# Patient Record
Sex: Female | Born: 1970 | Race: White | Hispanic: No | Marital: Married | Smoking: Former smoker
Health system: Southern US, Community
[De-identification: ages and names within clinical notes are randomized; demographics above are authoritative.]

## PROBLEM LIST (undated history)

## (undated) DIAGNOSIS — Z973 Presence of spectacles and contact lenses: Secondary | ICD-10-CM

## (undated) DIAGNOSIS — Z87442 Personal history of urinary calculi: Secondary | ICD-10-CM

## (undated) DIAGNOSIS — B009 Herpesviral infection, unspecified: Secondary | ICD-10-CM

## (undated) DIAGNOSIS — I728 Aneurysm of other specified arteries: Secondary | ICD-10-CM

## (undated) DIAGNOSIS — N200 Calculus of kidney: Secondary | ICD-10-CM

## (undated) DIAGNOSIS — R310 Gross hematuria: Secondary | ICD-10-CM

## (undated) DIAGNOSIS — Z1501 Genetic susceptibility to malignant neoplasm of breast: Secondary | ICD-10-CM

## (undated) DIAGNOSIS — Z86018 Personal history of other benign neoplasm: Secondary | ICD-10-CM

## (undated) DIAGNOSIS — F419 Anxiety disorder, unspecified: Secondary | ICD-10-CM

## (undated) DIAGNOSIS — Z1509 Genetic susceptibility to other malignant neoplasm: Secondary | ICD-10-CM

## (undated) HISTORY — DX: Herpesviral infection, unspecified: B00.9

## (undated) HISTORY — PX: TONSILLECTOMY: SUR1361

---
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
## 1998-09-29 HISTORY — PX: BREAST SURGERY: SHX581

## 2000-04-07 ENCOUNTER — Other Ambulatory Visit: Admission: RE | Admit: 2000-04-07 | Discharge: 2000-04-07 | Payer: Self-pay | Admitting: Gynecology

## 2000-10-19 ENCOUNTER — Inpatient Hospital Stay (HOSPITAL_COMMUNITY): Admission: AD | Admit: 2000-10-19 | Discharge: 2000-10-21 | Payer: Self-pay | Admitting: Gynecology

## 2000-12-09 ENCOUNTER — Other Ambulatory Visit: Admission: RE | Admit: 2000-12-09 | Discharge: 2000-12-09 | Payer: Self-pay | Admitting: Gynecology

## 2002-08-03 ENCOUNTER — Other Ambulatory Visit: Admission: RE | Admit: 2002-08-03 | Discharge: 2002-08-03 | Payer: Self-pay | Admitting: Gynecology

## 2003-06-13 ENCOUNTER — Other Ambulatory Visit: Admission: RE | Admit: 2003-06-13 | Discharge: 2003-06-13 | Payer: Self-pay | Admitting: Gynecology

## 2004-01-06 ENCOUNTER — Encounter (INDEPENDENT_AMBULATORY_CARE_PROVIDER_SITE_OTHER): Payer: Self-pay | Admitting: Specialist

## 2004-01-06 ENCOUNTER — Inpatient Hospital Stay (HOSPITAL_COMMUNITY): Admission: AD | Admit: 2004-01-06 | Discharge: 2004-01-08 | Payer: Self-pay | Admitting: Gynecology

## 2004-02-19 ENCOUNTER — Other Ambulatory Visit: Admission: RE | Admit: 2004-02-19 | Discharge: 2004-02-19 | Payer: Self-pay | Admitting: Gynecology

## 2005-03-17 ENCOUNTER — Other Ambulatory Visit: Admission: RE | Admit: 2005-03-17 | Discharge: 2005-03-17 | Payer: Self-pay | Admitting: Gynecology

## 2005-04-08 ENCOUNTER — Encounter: Admission: RE | Admit: 2005-04-08 | Discharge: 2005-04-08 | Payer: Self-pay | Admitting: Gynecology

## 2005-05-17 ENCOUNTER — Encounter: Admission: RE | Admit: 2005-05-17 | Discharge: 2005-05-17 | Payer: Self-pay | Admitting: Gynecology

## 2006-03-19 ENCOUNTER — Other Ambulatory Visit: Admission: RE | Admit: 2006-03-19 | Discharge: 2006-03-19 | Payer: Self-pay | Admitting: Gynecology

## 2006-04-09 ENCOUNTER — Encounter: Admission: RE | Admit: 2006-04-09 | Discharge: 2006-04-09 | Payer: Self-pay | Admitting: Gynecology

## 2007-03-22 ENCOUNTER — Other Ambulatory Visit: Admission: RE | Admit: 2007-03-22 | Discharge: 2007-03-22 | Payer: Self-pay | Admitting: Gynecology

## 2007-08-30 ENCOUNTER — Encounter: Admission: RE | Admit: 2007-08-30 | Discharge: 2007-08-30 | Payer: Self-pay | Admitting: Gynecology

## 2008-03-24 ENCOUNTER — Other Ambulatory Visit: Admission: RE | Admit: 2008-03-24 | Discharge: 2008-03-24 | Payer: Self-pay | Admitting: Gynecology

## 2008-05-30 ENCOUNTER — Ambulatory Visit: Payer: Self-pay | Admitting: Gynecology

## 2008-08-30 ENCOUNTER — Encounter: Admission: RE | Admit: 2008-08-30 | Discharge: 2008-08-30 | Payer: Self-pay | Admitting: Gynecology

## 2008-09-11 ENCOUNTER — Encounter: Admission: RE | Admit: 2008-09-11 | Discharge: 2008-09-11 | Payer: Self-pay | Admitting: Gynecology

## 2009-03-28 ENCOUNTER — Encounter: Payer: Self-pay | Admitting: Gynecology

## 2009-03-28 ENCOUNTER — Other Ambulatory Visit: Admission: RE | Admit: 2009-03-28 | Discharge: 2009-03-28 | Payer: Self-pay | Admitting: Gynecology

## 2009-03-28 ENCOUNTER — Ambulatory Visit: Payer: Self-pay | Admitting: Gynecology

## 2009-05-30 ENCOUNTER — Ambulatory Visit: Payer: Self-pay | Admitting: Gynecology

## 2009-06-29 ENCOUNTER — Ambulatory Visit: Payer: Self-pay | Admitting: Gynecology

## 2009-09-05 ENCOUNTER — Encounter: Admission: RE | Admit: 2009-09-05 | Discharge: 2009-09-05 | Payer: Self-pay | Admitting: Gynecology

## 2010-03-29 ENCOUNTER — Other Ambulatory Visit: Admission: RE | Admit: 2010-03-29 | Discharge: 2010-03-29 | Payer: Self-pay | Admitting: Gynecology

## 2010-03-29 ENCOUNTER — Ambulatory Visit: Payer: Self-pay | Admitting: Gynecology

## 2010-09-06 ENCOUNTER — Encounter
Admission: RE | Admit: 2010-09-06 | Discharge: 2010-09-06 | Payer: Self-pay | Source: Home / Self Care | Attending: Gynecology | Admitting: Gynecology

## 2010-09-16 ENCOUNTER — Encounter
Admission: RE | Admit: 2010-09-16 | Discharge: 2010-09-16 | Payer: Self-pay | Source: Home / Self Care | Attending: Gynecology | Admitting: Gynecology

## 2010-10-20 ENCOUNTER — Encounter: Payer: Self-pay | Admitting: Gynecology

## 2011-02-14 NOTE — Discharge Summary (Signed)
NAME:  Erica Burch, Erica Burch                     ACCOUNT NO.:  [id number]   MEDICAL RECORD NO.:  [id number]                   PATIENT TYPE:  INP   LOCATION:  9121                                 FACILITY:  [facility]   PHYSICIAN:  Timothy P. Fontaine, M.D.           DATE OF BIRTH:  [birth year]   DATE OF ADMISSION:  01/06/2004  DATE OF DISCHARGE:  01/08/2004                                 DISCHARGE SUMMARY   DISCHARGE DIAGNOSES:  1. Pregnancy at term, delivered.  2. Perineal mole.   PROCEDURE:  1. Vaginal delivery of female infant on January 06, 2004.  2. Excision perineal mole on January 06, 2004.   Pathology [id number]:  1. Condyloma acuminata.  2. Placenta:  Meconium, mild early acute chorioamnionitis.   HOSPITAL COURSE:  A 40 year old G107 P2 female, term gestation, admitted in  labor.  Ultimately underwent spontaneous vaginal delivery producing a normal  female infant, Apgars 9 and 9.  There was no episiotomy, no laceration.  The  patient had a small mole left upper perineum that was excised.  The  patient's postpartum course was uncomplicated and she was discharged on  postpartum day #2 with a postpartum hemoglobin of 10.9.  The patient  received precautions, instructions, and follow-up to be seen in the office  for 6 weeks and will use Motrin p.r.n. for pain.                                               Timothy P. Audie Box, M.D.    TPF/MEDQ  D:  01/24/2004  T:  01/24/2004  Job:  [id number]

## 2011-02-14 NOTE — Discharge Summary (Signed)
[facility]  Patient:    Erica Burch, Erica Burch                    MRN: [id number] Adm. Date:  [id number] Disc. Date: [id number] Attending:  Tonye Royalty Dictator:   Antony Contras, NP                           Discharge Summary  DISCHARGE DIAGNOSIS:          Intrauterine pregnancy at 39-6-7 weeks, advanced cervical dilatation.  PROCEDURE:                    Vacuum-assisted vaginal delivery of viable infant over intact perineum.  HISTORY OF PRESENT ILLNESS:   The patient is a 40 year old gravida 2, para 1-0-0-1 with LMP January 15, 2000.  [facility] October 21, 2000.  Prenatal course was uncomplicated.  LABORATORY DATA:              Blood type A+.  Antibody screen negative. Sickle cell trait was positive.  RPR, hepatitis B surface antigen, HIV nonreactive, maternal serum alpha-fetoprotein normal.  GBS negative.  HOSPITAL COURSE AND TREATMENT:                    The patient was admitted for on October 19, 2000, at 39-6/7 weeks secondary to contractions and advanced cervical dilatation to 4 cm, 90% effaced, -2 station.  Artificial rupture of membranes revealed clear fluid. She progressed to complete dilatation.  She did have some deep, variable decelerations.  Delivery was accomplished per vacuum-assistance over no episiotomy.  She was delivered of Apgars 8 and 9 female infant weighing 7 pounds 2 ounces.  Placenta was delivered intact spontaneously.  POSTPARTUM COURSE:            The patient remained afebrile, had no difficulty voiding, was able to be discharged on her second postpartum day in satisfactory condition.  CBC - hematocrit 27, hemoglobin 9.9, WBC 11.5, platelets 249.  DISPOSITION:                  Follow up in six weeks.  Continue prenatal vitamins and iron.  Motrin and Tylox for pain. DD:  11/16/00 TD:  11/17/00 Job: [id number]

## 2011-02-14 NOTE — Op Note (Signed)
NAME:  Erica Burch, Erica Burch                     ACCOUNT NO.:  [id number]   MEDICAL RECORD NO.:  [id number]                   PATIENT TYPE:  INP   LOCATION:  9164                                 FACILITY:  [facility]   PHYSICIAN:  Charles A. Sydnee Cabal, MD            DATE OF BIRTH:  [birth year]   DATE OF PROCEDURE:  01/06/2004  DATE OF DISCHARGE:                                 OPERATIVE REPORT   This 40 year old female para 2-0-0-2, [facility] January 09, 2004, 39 weeks, 2 days  estimated gestational age presented complaining of onset of contractions at  0400 this morning q.5 minutes.  She notes effective fetal movements, no  ruptured membranes, bleeding.  She was 5 cm dilated upon admission.  She was  Rh positive, rubella immune.  One hour Glucola 92.  Hemoglobin 11.1.  At 28  weeks, group B Strep negative.   PAST MEDICAL HISTORY:  None.   PAST SURGICAL HISTORY:  Spontaneous vaginal delivery x2.   COMPLICATIONS:  None.   MEDICATIONS:  Prenatal vitamins.   ALLERGIES:  No known drug allergies.   SOCIAL HISTORY:  Married.  No tobacco, ethanol or drug use.   FAMILY HISTORY:  Noncontributory.   PHYSICAL EXAMINATION:  VITAL SIGNS:  Normal blood pressure.  HEENT:  Grossly normal.  LUNGS:  Clear.  CARDIOVASCULAR:  Regular rate and rhythm.  ABDOMEN:  Gravid.  Estimated fetal weight 3600 g.  Fundal height 36 cm.  Cervix 6, complete, 0, vertex, bulging bag of water.  EXTREMITIES:  Nontender, minimal edema.   Artificial rupture of membranes was done to augment labor.  Clear amniotic  fluid was noted.  There were no complications.  The patient became complete  shortly thereafter, after going on with spontaneous labor, being completely  dilated at 1830, having spontaneous vaginal delivery at 1843.  Placenta  followed spontaneously at 1849.  She had a vigorous female, Apgars 9 and 9.  Father of the baby cut the cord.  Placenta was spontaneous, three vessel and  intact.  The patient tolerated the delivery  well without complications.  The  estimated blood loss was less than 300 mL.   She requested that I remove a pedunculated mole on the left perineum mons  area.  This was bothersome in that it had interruption issues with panty  line and clothing.  This had been present for a longtime without change  other than bothersome position.  Informed consent was given.  Lidocaine was  injected around the base and mole was excised.  Silver nitrate cautery was used to achieve hemostasis after pressure was  placed.  Estimated blood loss less than 5 mL.  Mole sent to pathology.  Placenta to pathology.  Of note, placenta was consistent with retroplacental  clot over approximately 25% of the placenta.  This was asymptomatic with the  baby as far as the heart rate tracing is noted.  Charles A. Sydnee Cabal, MD    CAD/MEDQ  D:  01/06/2004  T:  01/08/2004  Job:  [id number]

## 2011-02-14 NOTE — H&P (Signed)
[facility] of [facility]  Patient:    Erica Burch, Erica Burch                    MRN: [id number] Attending:  Gaetano Hawthorne. Lily Peer, M.D.                         History and Physical  CHIEF COMPLAINT:              1. Contractions.                               2. Advanced cervical dilatation.  HISTORY:                      The patient is a 40 year old gravida 2, para 1 whose last menstrual period was January 15, 2000, estimated date of confinement October 21, 2000.  Currently 39-6/[redacted] weeks gestation.  Presented to the office today having increased contractions every 10 minutes apart and sometimes more frequent during the weekend.  On arrival to the office she was placed on the monitor.  She had a beautifully reactive NST, very mild infrequent contractions, if any, were noted on the nonstress test but a pelvic examination did demonstrate that her cervix was 4 cm, 90% effaced, -2 station. Her group B strep culture had been negative.  Her prenatal course essentially has been uneventful except for the fact at 38 weeks she was treated for what appeared to be a strep throat and had been placed on Z-Pak.  PAST MEDICAL HISTORY:         Patient with normal spontaneous vaginal delivery in 1992 at 40 weeks without any complications.  Patient had varicella as a child.  She has cholecystectomy in 1993.  She had a left benign breast lump removed in May 2000.  ALLERGIES:                    Denies.  REVIEW OF SYSTEMS:            See [place] form.  PHYSICAL EXAMINATION:  GENERAL:                      Well-developed, well-nourished female.  HEENT:                        Unremarkable.  NECK:                         Supple.  Trachea:  Midline.  No carotid bruits. No thyromegaly.  LUNGS:                        Clear to auscultation without rhonchi or wheezes.  HEART:                        Regular rate and rhythm.  No murmurs or gallops.  BREASTS:                      Done during  the first trimester, is reported to be normal.  ABDOMEN:                      Gravid uterus.  Vertex presentation by [facility] maneuver.  Positive fetal heart tones.  PELVIC:  Cervix 4 cm dilated, 90% effaced, -2 station.  EXTREMITIES:                  DTRs 1+.  Negative clonus.  PRENATAL LABORATORIES:        A positive blood type.  Negative antibody screen.  VDRL, hepatitis B surface antigen, HIV were negative.  Rubella with evidence of immunity.  Pap smear was normal.  alpha-fetoprotein and blood sugar tests were normal.  GBS culture was negative.  Patient had a low hemoglobin of 10.6 at 28 weeks for which she has been taking some supplemental iron.  ASSESSMENT:                   A 40 year old gravida 2, para 1 at 36 6/[redacted] weeks gestation with advanced cervical dilatation and labor based on history. Patient with negative GBS culture.  Urinalysis today in the office had demonstrated that there were 8-10 wbcs, 2-3 rbcs, and 3+ bacteria.  [facility]  She wished to have an epidural at this point since her cervix advanced at 4 cm.  Will start off with an epidural then initiate Pitocin and augment accordingly.  Will also give her 1 g Cefotan to cover for an underlying urinary tract infection.  Patient was counseled. Risks, benefits, pros, and cons were discussed.  Will follow according to plan.  Patient will be admitted to [facility] today. DD:  10/19/00 TD:  10/19/00 Job: [id number]

## 2011-04-03 ENCOUNTER — Other Ambulatory Visit (HOSPITAL_COMMUNITY)
Admission: RE | Admit: 2011-04-03 | Discharge: 2011-04-03 | Disposition: A | Discharge status: Home / Self Care | Payer: 59 | Source: Ambulatory Visit | Attending: Gynecology | Admitting: Gynecology

## 2011-04-03 ENCOUNTER — Encounter (INDEPENDENT_AMBULATORY_CARE_PROVIDER_SITE_OTHER): Payer: 59 | Admitting: Gynecology

## 2011-04-03 ENCOUNTER — Other Ambulatory Visit: Payer: Self-pay | Admitting: Gynecology

## 2011-04-03 DIAGNOSIS — Z124 Encounter for screening for malignant neoplasm of cervix: Secondary | ICD-10-CM | POA: Insufficient documentation

## 2011-04-03 DIAGNOSIS — R1903 Right lower quadrant abdominal swelling, mass and lump: Secondary | ICD-10-CM

## 2011-04-03 DIAGNOSIS — D259 Leiomyoma of uterus, unspecified: Secondary | ICD-10-CM

## 2011-04-03 DIAGNOSIS — Z13220 Encounter for screening for lipoid disorders: Secondary | ICD-10-CM

## 2011-04-03 DIAGNOSIS — M545 Low back pain, unspecified: Secondary | ICD-10-CM

## 2011-04-03 DIAGNOSIS — R1031 Right lower quadrant pain: Secondary | ICD-10-CM

## 2011-04-03 DIAGNOSIS — Z833 Family history of diabetes mellitus: Secondary | ICD-10-CM

## 2011-04-03 DIAGNOSIS — R635 Abnormal weight gain: Secondary | ICD-10-CM

## 2011-04-03 DIAGNOSIS — N949 Unspecified condition associated with female genital organs and menstrual cycle: Secondary | ICD-10-CM

## 2011-04-03 DIAGNOSIS — Z01419 Encounter for gynecological examination (general) (routine) without abnormal findings: Secondary | ICD-10-CM

## 2011-04-03 DIAGNOSIS — R809 Proteinuria, unspecified: Secondary | ICD-10-CM

## 2011-04-22 ENCOUNTER — Encounter (HOSPITAL_BASED_OUTPATIENT_CLINIC_OR_DEPARTMENT_OTHER): Payer: 59 | Admitting: Oncology

## 2011-04-22 DIAGNOSIS — Z1501 Genetic susceptibility to malignant neoplasm of breast: Secondary | ICD-10-CM

## 2011-04-29 ENCOUNTER — Ambulatory Visit (INDEPENDENT_AMBULATORY_CARE_PROVIDER_SITE_OTHER): Payer: 59 | Admitting: Gynecology

## 2011-04-29 ENCOUNTER — Encounter: Payer: Self-pay | Admitting: Gynecology

## 2011-04-29 VITALS — BP 122/88

## 2011-04-29 DIAGNOSIS — Z1501 Genetic susceptibility to malignant neoplasm of breast: Secondary | ICD-10-CM

## 2011-04-29 DIAGNOSIS — Z803 Family history of malignant neoplasm of breast: Secondary | ICD-10-CM

## 2011-04-29 DIAGNOSIS — D252 Subserosal leiomyoma of uterus: Secondary | ICD-10-CM

## 2011-04-29 DIAGNOSIS — R102 Pelvic and perineal pain: Secondary | ICD-10-CM

## 2011-04-29 DIAGNOSIS — N949 Unspecified condition associated with female genital organs and menstrual cycle: Secondary | ICD-10-CM

## 2011-04-29 NOTE — Progress Notes (Signed)
Consultation today with Erica Burch in reference to her symptomatic leiomyomatous uteri. 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 I have just spoken with Dr. Welton Flakes medical oncologist and I had referred the patient for consultation asked to consideration of removing her ovaries at time of her hysterectomy. Her last ultrasound the office on July 5 demonstrated uterus to measure 9.1 x 5.7 x 4.3 cm with an endometrial stripe of 2.7 mm she had a right pedunculated myoma measuring 8.3 x 4.9 x 8.0 cm and right and left ovary with several follicles patient's cancer yearly mammogram up-to-date as well as her mammogram. Patient's fibroids are causing her much symptoms despite her having a Mirena IUD she complains a lot of low back discomfort constipation and right lower abdominal sided pain. I spoke with Dr. Doylene Canard today and her recommendation would be to remove patient's ovary at time of her planned total laparoscopic hysterectomy. She would recommend the patient be placed in a low dose estrogen for short period of time and perhaps consideration simultaneously place her on SSRI such as Effexor. Patient's CA 125 had been normal recently. Based on her being a BRCA1 carrier she has a 60% risk of developing ovarian cancer. Her risk of breast cancer is reduced by 30 to 40s percent by removing both ovaries was a "to Dr. Dionne Milo given me. Patient is still not want to have a mastectomy at this time. She had been referred to Dr. Dillard Essex for consideration of prophylactic mastectomy and he had quoted a reduction of breast cancer by 90% the patient decided to proceed with a prophylactic mastectomy. She would like to wait would later times to have this done. Patient is with positive BRCA one G. carrier have a 75-80% risk of breast cancer. I discussed  these issues with Erica Burch today and we will through a detailed discussion of her planned surgery as she would like to get it done as soon as possible. We will proceed with get scheduling her total laparoscopic hysterectomy with bilateral salpingo-oophorectomy. Literature formation it previously been provided as well. All questions rancher will follow accordingly.

## 2011-05-12 ENCOUNTER — Encounter (HOSPITAL_COMMUNITY)
Admission: RE | Admit: 2011-05-12 | Discharge: 2011-05-12 | Payer: 59 | Source: Ambulatory Visit | Attending: Gynecology | Admitting: Gynecology

## 2011-05-12 ENCOUNTER — Encounter (HOSPITAL_COMMUNITY): Payer: Self-pay

## 2011-05-12 LAB — CBC
MCV: 85.5 fL (ref 78.0–100.0)
Platelets: 238 10*3/uL (ref 150–400)
RDW: 12 % (ref 11.5–15.5)
WBC: 6.4 10*3/uL (ref 4.0–10.5)

## 2011-05-12 LAB — SURGICAL PCR SCREEN
MRSA, PCR: NEGATIVE
Staphylococcus aureus: NEGATIVE

## 2011-05-12 NOTE — Patient Instructions (Signed)
  Mysty Kielty Your procedure is scheduled ZO:[id number] 05/19/11 Enter through the Main Entrance of [facility] at:11:30am Pick up the phone a the desk and dial [phone] Please call this number if you have any problems the morning of surgery: [phone]  Remember: Do not eat food after midnight  Do not drink clear liquids after:Monday 9am Take these medicines the morning of surgery with a SIP OF WATER:meds  Do not wear jewelry, make-up, or nail polish Do not wear lotions, powders, or perfumes.no deodorants Do not shave 48 hours prior to surgery. Do not bring valuables to the hospital. Leave suitcase in the car. After Surgery it may be brought to your room. For patients being admitted to the hospital, checkout time is 11:00am the day of discharge.   Please read over the Hibiclens Guide to General Skin Cleansing at Home. Remember that hibiclens is not to be used on the head, face, or vaginal area. Please shower with 1/2 bottle the evening before surgery and other 1/2 bottle morning of surgery prior to arrival at hospital.  Do not use any deodorants, lotions, or powders after hibiclens shower.

## 2011-05-13 ENCOUNTER — Encounter (HOSPITAL_COMMUNITY): Payer: Self-pay | Admitting: Gynecology

## 2011-05-18 ENCOUNTER — Encounter (HOSPITAL_COMMUNITY): Payer: Self-pay | Admitting: Gynecology

## 2011-05-18 DIAGNOSIS — D259 Leiomyoma of uterus, unspecified: Secondary | ICD-10-CM | POA: Diagnosis present

## 2011-05-18 DIAGNOSIS — Z1501 Genetic susceptibility to malignant neoplasm of breast: Secondary | ICD-10-CM | POA: Diagnosis present

## 2011-05-18 NOTE — H&P (Signed)
  Erica Burch [birth year] [id number]   History:    40 y.o. G3P3 scheduled for TLH/BSO secondary to symptomatic leiomyomatous uteri. Patient is a BRCA1 Carrier for breat cancer and has strong family history of breast cancer (mother breast cancer age 88, 2 grandmothers on mother's side, as well as an aunt). Patient has received genetic counseling from Dr. Welton Flakes at the [facility] here in [place] and have recommended to proceed with BSO at time of hysterectomy  Due to high association of ovarian cancerin patients who are are BRCA1 Positive carriers for breast cancer as well. Patient will consider a prophylactic (bilateral) at a later date).  Past medical history,surgical history, family history and social history were all reviewed and documented in the EPIC chart. ROS:  Was performed and pertinent positives and negatives are included in the history.   Exam: Wt:150 lbs  BMI 26  BP:124/88 chaperone present General appearance : Well developed well nourished female. Skin grossly normal HEENT: Neck supple, trachea midline Lungs: Clear to auscultation, no rhonchi or wheezes Heart: Regular rate and rhythm, no murmurs or gallops Breast:Examined in sitting and supine position were symmetrical in appearance, no palpable masses, to skin retraction, no nipple inversion, no nipple discharge and no axillary or supraclavicular lymphadenopathy Abdomen: no palpable masses or tenderness Pelvic  Ext/BUS/vagina  normal   Cervix  Normal, IUD string seen   Uterus  Irregular shaped with fundal fibroid   Adnexa  Without masses or tenderness  Anus and perineum  normal   Rectovaginal  normal sphincter tone without palpated masses or tenderness             Hemoccult not done     Assessment/Plan:  40 y.o. G3P3 (vaginal deliveries) with symptomatic fibroid uterus and a carrier for BRCA1 Breast Cancer. Ultrasound in office demonstrated a uterus that measured 9x5x4 cm with a right pedunculated fibroid  measuring 8.3x4.9x8.0 cm. IUD seen in cavity with stripe of 2.7 mm. The risk and benefits of the procedure were discussed with patient and all questions were answered. Risk of: hemorrhage and needing transfusion with risk of anaphylactic reaction, HIV, Hepatitis from donor blood to recipient were discussed. Also, risk of DVT/PE  and need for PSA Stockings were discussed. There is also risk of trauma to internal organs requiring surgical repair to bladder, intestines, ureters and other internal structures. Also in the event of technical difficulty an open abdominal approach may be used to complete the operation which would require additional days of hospitalization. Patient also understands that she will not be able to get pregnant in the future. She also understands that this could put her in a menopausal state and that with the recommendation of the medical oncologist she would go on a short course of low dose estrogen to help alleviate those symptoms. Patient fully understands all the implications of here surgery and all questions have been answered and will proceed with TLH/BSO possible TAH/BSO Ok Edwards MD, 10:42 AM 05/18/2011     An

## 2011-05-19 ENCOUNTER — Inpatient Hospital Stay (HOSPITAL_COMMUNITY)
Admission: RE | Admit: 2011-05-19 | Discharge: 2011-05-21 | DRG: 743 | Disposition: A | Discharge status: Home / Self Care | Payer: 59 | Source: Ambulatory Visit | Attending: Gynecology | Admitting: Gynecology

## 2011-05-19 ENCOUNTER — Encounter (HOSPITAL_COMMUNITY): Payer: Self-pay | Admitting: Anesthesiology

## 2011-05-19 ENCOUNTER — Other Ambulatory Visit: Payer: Self-pay | Admitting: Gynecology

## 2011-05-19 ENCOUNTER — Inpatient Hospital Stay (HOSPITAL_COMMUNITY): Payer: 59 | Admitting: Anesthesiology

## 2011-05-19 ENCOUNTER — Encounter (HOSPITAL_COMMUNITY)
Admission: RE | Disposition: A | Discharge status: Home / Self Care | Payer: Self-pay | Source: Ambulatory Visit | Attending: Gynecology

## 2011-05-19 ENCOUNTER — Encounter (HOSPITAL_COMMUNITY): Payer: Self-pay | Admitting: *Deleted

## 2011-05-19 DIAGNOSIS — D259 Leiomyoma of uterus, unspecified: Secondary | ICD-10-CM

## 2011-05-19 DIAGNOSIS — N8300 Follicular cyst of ovary, unspecified side: Secondary | ICD-10-CM | POA: Diagnosis present

## 2011-05-19 DIAGNOSIS — Z803 Family history of malignant neoplasm of breast: Secondary | ICD-10-CM

## 2011-05-19 DIAGNOSIS — N838 Other noninflammatory disorders of ovary, fallopian tube and broad ligament: Secondary | ICD-10-CM | POA: Diagnosis present

## 2011-05-19 DIAGNOSIS — D251 Intramural leiomyoma of uterus: Secondary | ICD-10-CM | POA: Diagnosis present

## 2011-05-19 DIAGNOSIS — D252 Subserosal leiomyoma of uterus: Principal | ICD-10-CM | POA: Diagnosis present

## 2011-05-19 DIAGNOSIS — Z1501 Genetic susceptibility to malignant neoplasm of breast: Secondary | ICD-10-CM | POA: Diagnosis present

## 2011-05-19 HISTORY — PX: LAPAROSCOPY: SHX197

## 2011-05-19 HISTORY — PX: ABDOMINAL HYSTERECTOMY: SHX81

## 2011-05-19 HISTORY — PX: SALPINGOOPHORECTOMY: SHX82

## 2011-05-19 LAB — COMPREHENSIVE METABOLIC PANEL
Albumin: 4.4 g/dL (ref 3.5–5.2)
BUN: 6 mg/dL (ref 6–23)
Chloride: 104 mEq/L (ref 96–112)
Creatinine, Ser: 0.6 mg/dL (ref 0.50–1.10)
Total Bilirubin: 0.9 mg/dL (ref 0.3–1.2)

## 2011-05-19 LAB — CBC
HCT: 32.4 % — ABNORMAL LOW (ref 36.0–46.0)
Platelets: 197 10*3/uL (ref 150–400)
RDW: 12 % (ref 11.5–15.5)
WBC: 10.9 10*3/uL — ABNORMAL HIGH (ref 4.0–10.5)

## 2011-05-19 LAB — URINALYSIS, ROUTINE W REFLEX MICROSCOPIC
Glucose, UA: NEGATIVE mg/dL
Leukocytes, UA: NEGATIVE
Nitrite: NEGATIVE
Protein, ur: NEGATIVE mg/dL
Urobilinogen, UA: 0.2 mg/dL (ref 0.0–1.0)

## 2011-05-19 LAB — PREGNANCY, URINE: Preg Test, Ur: NEGATIVE

## 2011-05-19 LAB — URINE MICROSCOPIC-ADD ON

## 2011-05-19 SURGERY — HYSTERECTOMY, ABDOMINAL
Anesthesia: General | Site: Uterus | Wound class: Clean Contaminated

## 2011-05-19 MED ORDER — MIDAZOLAM HCL 5 MG/5ML IJ SOLN
INTRAMUSCULAR | Status: DC | PRN
  Administered 2011-05-19: 2 mg via INTRAVENOUS

## 2011-05-19 MED ORDER — DEXAMETHASONE SODIUM PHOSPHATE 4 MG/ML IJ SOLN
INTRAMUSCULAR | Status: DC | PRN
  Administered 2011-05-19: 10 mg via INTRAVENOUS

## 2011-05-19 MED ORDER — ONDANSETRON HCL 4 MG/2ML IJ SOLN: 4.0000 mg | Freq: Four times a day (QID) | INTRAMUSCULAR | Status: DC | PRN

## 2011-05-19 MED ORDER — DIPHENHYDRAMINE HCL 12.5 MG/5ML PO ELIX: 12.5000 mg | ORAL_SOLUTION | Freq: Four times a day (QID) | ORAL | Status: DC | PRN

## 2011-05-19 MED ORDER — CEFAZOLIN SODIUM 1-5 GM-% IV SOLN
1.0000 g | Freq: Once | INTRAVENOUS | Status: AC
  Administered 2011-05-19: 1 g via INTRAVENOUS

## 2011-05-19 MED ORDER — METOCLOPRAMIDE HCL 5 MG/ML IJ SOLN: 10.0000 mg | Freq: Once | INTRAMUSCULAR | Status: DC | PRN

## 2011-05-19 MED ORDER — PROPOFOL 10 MG/ML IV EMUL
INTRAVENOUS | Status: DC | PRN
  Administered 2011-05-19: 180 mg via INTRAVENOUS

## 2011-05-19 MED ORDER — ONDANSETRON HCL 4 MG/2ML IJ SOLN
INTRAMUSCULAR | Status: AC
  Filled 2011-05-19: qty 2

## 2011-05-19 MED ORDER — GLYCOPYRROLATE 0.2 MG/ML IJ SOLN
INTRAMUSCULAR | Status: DC | PRN
  Administered 2011-05-19: .3 mg via INTRAVENOUS
  Administered 2011-05-19: 0.2 mg via INTRAVENOUS

## 2011-05-19 MED ORDER — SUFENTANIL CITRATE 50 MCG/ML IV SOLN
INTRAVENOUS | Status: AC
  Filled 2011-05-19: qty 1

## 2011-05-19 MED ORDER — SODIUM CHLORIDE 0.9 % IJ SOLN: 9.0000 mL | INTRAMUSCULAR | Status: DC | PRN

## 2011-05-19 MED ORDER — SCOPOLAMINE 1 MG/3DAYS TD PT72
1.0000 | MEDICATED_PATCH | Freq: Once | TRANSDERMAL | Status: DC
  Administered 2011-05-19: 1.5 mg via TRANSDERMAL

## 2011-05-19 MED ORDER — DIPHENHYDRAMINE HCL 50 MG/ML IJ SOLN
12.5000 mg | Freq: Four times a day (QID) | INTRAMUSCULAR | Status: DC | PRN
  Administered 2011-05-20: 12.5 mg via INTRAVENOUS
  Filled 2011-05-19: qty 1

## 2011-05-19 MED ORDER — LACTATED RINGERS IV SOLN
INTRAVENOUS | Status: DC
  Administered 2011-05-20 (×3): via INTRAVENOUS

## 2011-05-19 MED ORDER — MIDAZOLAM HCL 2 MG/2ML IJ SOLN
INTRAMUSCULAR | Status: AC
  Filled 2011-05-19: qty 4

## 2011-05-19 MED ORDER — BUPIVACAINE HCL (PF) 0.25 % IJ SOLN
INTRAMUSCULAR | Status: DC | PRN
  Administered 2011-05-19: 18 mL

## 2011-05-19 MED ORDER — MORPHINE SULFATE (PF) 1 MG/ML IV SOLN
INTRAVENOUS | Status: DC
  Administered 2011-05-19: 25 mg via INTRAVENOUS
  Administered 2011-05-19: 15 mg via INTRAVENOUS
  Administered 2011-05-19 (×2): 25 mg via INTRAVENOUS
  Administered 2011-05-20: 12 mg via INTRAVENOUS
  Administered 2011-05-20: 7.5 mg via INTRAVENOUS
  Administered 2011-05-20: 25 mg via INTRAVENOUS
  Filled 2011-05-19 (×4): qty 25

## 2011-05-19 MED ORDER — NALOXONE HCL 0.4 MG/ML IJ SOLN: 0.4000 mg | INTRAMUSCULAR | Status: DC | PRN

## 2011-05-19 MED ORDER — GLYCOPYRROLATE 0.2 MG/ML IJ SOLN
INTRAMUSCULAR | Status: AC
  Filled 2011-05-19: qty 1

## 2011-05-19 MED ORDER — FENTANYL CITRATE 0.05 MG/ML IJ SOLN
INTRAMUSCULAR | Status: AC
  Filled 2011-05-19: qty 2

## 2011-05-19 MED ORDER — FENTANYL CITRATE 0.05 MG/ML IJ SOLN
INTRAMUSCULAR | Status: DC | PRN
  Administered 2011-05-19 (×2): 25 ug via INTRAVENOUS

## 2011-05-19 MED ORDER — SCOPOLAMINE 1 MG/3DAYS TD PT72
MEDICATED_PATCH | TRANSDERMAL | Status: AC
  Filled 2011-05-19: qty 1

## 2011-05-19 MED ORDER — LACTATED RINGERS IV SOLN
INTRAVENOUS | Status: DC
  Administered 2011-05-19: 50 mL/h via INTRAVENOUS
  Administered 2011-05-19 (×2): via INTRAVENOUS

## 2011-05-19 MED ORDER — HYDROMORPHONE HCL 1 MG/ML IJ SOLN
0.2500 mg | INTRAMUSCULAR | Status: DC | PRN
  Administered 2011-05-19 (×2): 0.5 mg via INTRAVENOUS

## 2011-05-19 MED ORDER — LIDOCAINE HCL (CARDIAC) 20 MG/ML IV SOLN
INTRAVENOUS | Status: DC | PRN
  Administered 2011-05-19: 60 mg via INTRAVENOUS

## 2011-05-19 MED ORDER — PROPOFOL 10 MG/ML IV EMUL
INTRAVENOUS | Status: AC
  Filled 2011-05-19: qty 20

## 2011-05-19 MED ORDER — ROCURONIUM BROMIDE 50 MG/5ML IV SOLN
INTRAVENOUS | Status: AC
  Filled 2011-05-19: qty 1

## 2011-05-19 MED ORDER — NEOSTIGMINE METHYLSULFATE 1 MG/ML IJ SOLN
INTRAMUSCULAR | Status: DC | PRN
  Administered 2011-05-19: 3 mg via INTRAMUSCULAR

## 2011-05-19 MED ORDER — ROCURONIUM BROMIDE 100 MG/10ML IV SOLN
INTRAVENOUS | Status: DC | PRN
  Administered 2011-05-19: 45 mg via INTRAVENOUS

## 2011-05-19 MED ORDER — SUFENTANIL CITRATE 50 MCG/ML IV SOLN
INTRAVENOUS | Status: DC | PRN
  Administered 2011-05-19 (×5): 10 ug via INTRAVENOUS

## 2011-05-19 MED ORDER — CEFAZOLIN SODIUM 1-5 GM-% IV SOLN
INTRAVENOUS | Status: AC
  Filled 2011-05-19: qty 50

## 2011-05-19 MED ORDER — ONDANSETRON HCL 4 MG/2ML IJ SOLN
INTRAMUSCULAR | Status: DC | PRN
  Administered 2011-05-19: 4 mg via INTRAVENOUS

## 2011-05-19 MED ORDER — NEOSTIGMINE METHYLSULFATE 1 MG/ML IJ SOLN
INTRAMUSCULAR | Status: AC
  Filled 2011-05-19: qty 10

## 2011-05-19 MED ORDER — DEXAMETHASONE SODIUM PHOSPHATE 10 MG/ML IJ SOLN
INTRAMUSCULAR | Status: AC
  Filled 2011-05-19: qty 1

## 2011-05-19 MED ORDER — HYDROMORPHONE HCL 1 MG/ML IJ SOLN
INTRAMUSCULAR | Status: AC
  Administered 2011-05-19: 0.5 mg via INTRAVENOUS
  Filled 2011-05-19: qty 1

## 2011-05-19 MED ORDER — MEPERIDINE HCL 25 MG/ML IJ SOLN: 6.2500 mg | INTRAMUSCULAR | Status: DC | PRN

## 2011-05-19 SURGICAL SUPPLY — 57 items
BARRIER ADHS 3X4 INTERCEED (GAUZE/BANDAGES/DRESSINGS) IMPLANT
BLADE SURG 15 STRL LF C SS BP (BLADE) ×4 IMPLANT
BLADE SURG 15 STRL SS (BLADE) ×5
BRR ADH 4X3 ABS CNTRL BYND (GAUZE/BANDAGES/DRESSINGS)
CANISTER SUCTION 2500CC (MISCELLANEOUS) ×7 IMPLANT
CLOTH BEACON ORANGE TIMEOUT ST (SAFETY) ×5 IMPLANT
CONT PATH 16OZ SNAP LID 3702 (MISCELLANEOUS) IMPLANT
COVER MAYO STAND STRL (DRAPES) ×5 IMPLANT
COVER TABLE BACK 60X90 (DRAPES) ×7 IMPLANT
DERMABOND ADVANCED (GAUZE/BANDAGES/DRESSINGS) ×5 IMPLANT
DRAPE HYSTEROSCOPY (DRAPE) ×5 IMPLANT
DRAPE UTILITY XL STRL (DRAPES) ×5 IMPLANT
DRSG TEGADERM 2.38X2.75 (GAUZE/BANDAGES/DRESSINGS) IMPLANT
DRSG XEROFORM 1X8 (GAUZE/BANDAGES/DRESSINGS) ×5 IMPLANT
EVACUATOR SMOKE 8.L (FILTER) ×5 IMPLANT
GLOVE BIOGEL PI IND STRL 8 (GLOVE) ×4 IMPLANT
GLOVE BIOGEL PI INDICATOR 8 (GLOVE) ×1
GLOVE ECLIPSE 7.5 STRL STRAW (GLOVE) ×14 IMPLANT
GOWN PREVENTION PLUS LG XLONG (DISPOSABLE) ×17 IMPLANT
HARMONIC RUM II 3.5CM SILVER (DISPOSABLE) ×5
HEMOSTAT SURGICEL 4X8 (HEMOSTASIS) ×2 IMPLANT
NS IRRIG 1000ML POUR BTL (IV SOLUTION) ×5 IMPLANT
PACK LAVH (CUSTOM PROCEDURE TRAY) ×2 IMPLANT
PAD ABD DERMACEA PRESS 5X9 (GAUZE/BANDAGES/DRESSINGS) ×4 IMPLANT
PAD OB MATERNITY 4.3X12.25 (PERSONAL CARE ITEMS) ×3 IMPLANT
PAIN PUMP ON-Q 270MLX4ML 5IN (PAIN MANAGEMENT) IMPLANT
SCALPEL HARMONIC ACE (MISCELLANEOUS) ×5 IMPLANT
SCALPEL HRMNC RUM II 3.5 SILVR (DISPOSABLE) ×1 IMPLANT
SCISSORS LAP 5X35 DISP (ENDOMECHANICALS) ×2 IMPLANT
SET IRRIG TUBING LAPAROSCOPIC (IRRIGATION / IRRIGATOR) ×5 IMPLANT
SPONGE LAP 18X18 X RAY DECT (DISPOSABLE) ×14 IMPLANT
STAPLER VISISTAT 35W (STAPLE) ×3 IMPLANT
SUT CHROMIC 3 0 SH 27 (SUTURE) IMPLANT
SUT VIC AB 0 CT1 18XCR BRD8 (SUTURE) ×11 IMPLANT
SUT VIC AB 0 CT1 36 (SUTURE) ×10 IMPLANT
SUT VIC AB 0 CT1 8-18 (SUTURE) ×10
SUT VIC AB 1 CT1 18XBRD ANBCTR (SUTURE) IMPLANT
SUT VIC AB 1 CT1 8-18 (SUTURE)
SUT VIC AB 2-0 SH 27 (SUTURE) ×5
SUT VIC AB 2-0 SH 27XBRD (SUTURE) ×1 IMPLANT
SUT VIC AB 3-0 CT1 27 (SUTURE) ×10
SUT VIC AB 3-0 CT1 TAPERPNT 27 (SUTURE) ×2 IMPLANT
SUT VICRYL 0 TIES 12 18 (SUTURE) ×5 IMPLANT
SUT VICRYL 0 UR6 27IN ABS (SUTURE) ×5 IMPLANT
SUT VICRYL 3 0 BR 18  UND (SUTURE)
SUT VICRYL 3 0 BR 18 UND (SUTURE) IMPLANT
SUT VICRYL RAPIDE 3 0 (SUTURE) ×5 IMPLANT
SYR 50ML LL SCALE MARK (SYRINGE) ×5 IMPLANT
TIP UTERINE 6.7X8CM BLUE DISP (MISCELLANEOUS) ×2 IMPLANT
TOWEL OR 17X24 6PK STRL BLUE (TOWEL DISPOSABLE) ×14 IMPLANT
TRAY FOLEY CATH 14FR (SET/KITS/TRAYS/PACK) ×5 IMPLANT
TROCAR XCEL NON-BLD 11X100MML (ENDOMECHANICALS) ×10 IMPLANT
TROCAR XCEL NON-BLD 5MMX100MML (ENDOMECHANICALS) ×10 IMPLANT
TUBING CONNECTING 10 (TUBING) ×2 IMPLANT
WARMER LAPAROSCOPE (MISCELLANEOUS) ×5 IMPLANT
WATER STERILE IRR 1000ML POUR (IV SOLUTION) ×5 IMPLANT
YANKAUER SUCT BULB TIP NO VENT (SUCTIONS) ×2 IMPLANT

## 2011-05-19 NOTE — Transfer of Care (Signed)
Immediate Anesthesia Transfer of Care Note  Patient: Erica Burch  Procedure(s) Performed:  HYSTERECTOMY ABDOMINAL; SALPINGO OOPHERECTOMY; LAPAROSCOPY DIAGNOSTIC  Patient Location: PACU  Anesthesia Type: General  Level of Consciousness: awake, alert  and oriented  Airway & Oxygen Therapy: Patient Spontanous Breathing and Patient connected to nasal cannula oxygen  Post-op Assessment: Report given to PACU RN, Post -op Vital signs reviewed and stable and Patient moving all extremities  Post vital signs: Reviewed and stable  Complications: No apparent anesthesia complications

## 2011-05-19 NOTE — Op Note (Signed)
05/19/2011  2:51 PM  PATIENT:  Erica Burch  40 y.o. female   PRE-OPERATIVE DIAGNOSIS:  Fibroid; BRCA-1 Carier; Positive Family History of Breast Cancer  POST-OPERATIVE DIAGNOSIS:  Fibroid; BRCA-1 Carrier; Positive Family History of Breast Cancer  PROCEDURE:  Procedure(s): HYSTERECTOMY ABDOMINAL BILATERAL SALPINGO OOPHERECTOMY LAPAROSCOPY DIAGNOSTIC  SURGEON:  Surgeon(s): Ok Edwards, MD Dara Lords, MD  ANESTHESIA:   general    FINDINGS: A large posterior uterine fibroid pedunculated with wide base located posteriorly attached to the lower uterine segment and right pelvic sidewall. Normal-appearing tubes and ovaries bilaterally. No other pelvic abnormalities noted  DESCRIPTION OF OPERATION: The patient was taken to the operating room after consent form had been signed. Patient had PSA stockings for DVT prophylaxis. Patient also received a gram of Cefotan for prophylaxis as well. In the operating room she underwent a successful general endotracheal anesthesia. She was then placed in the low lithotomy position the abdomen vagina and perineum were prepped and draped in the usual sterile fashion. A bimanual examination demonstrated a normal-size uterus with a fibroid in the posterior region. A RUMI uterine manipulator with the appropriate cup was placed. The Foley catheter was inserted to monitor urinary output. The drapes were then placed in a small subumbilical incision was made in a semi-lunar shape and and the 10/11 mm trocar was inserted to the peritoneal cavity. A pneumoperitoneum was then established for approximately 3 L of carbon dioxide. A systematic inspection of the pelvic cavity had demonstrated that this large fibroid was encroaching into the cul-de-sac and adhered to the right side pelvic sidewall making it unsafe to proceed to remove this uterus cervix tubes and ovary via the laparoscopic approach. We proceeded then with a Pfannenstiel skin incision which was  made 2 cm above the symphysis pubis the incision was extended from the subcutaneous tissue down to the rectus fascia were by midline nick was made and the fascia was incised in a transverse fashion. The patient was then placed in the Trendelenburg position and the O'Connor-O'Sullivan retractors were then placed. The round ligament was identified on both sides and were transected first on the right side and incising the anterior peritoneum to peel off the bladder from the lower uterine segment. Posteriorly the right ureter was identified and the right infundibulopelvic ligament. The posterior broad ligament was penetrated and the infundibulopelvic ligament was clamped cut and free tied with 0 Vicryl suture followed by transfixation stitch of 0 Vicryl suture. The posterior fibroid was adhered to the right doubly sidewall but was manually freed allowing identification of the nearby structures. The remaining broad and cardinal ligaments on the right side were clamped cut suture ligated with 0 Vicryl suture to the level the right vaginal fornix. Similar procedure was carried out on the contralateral side and with 2 curved Heaney clamps placed in the cervical vaginal region with the use of Jorgenson scissors the cervix was excised from the vagina and the cervix uterus both tubes and ovaries and fibroid was passed off the operative field for pathological evaluation. Of note pelvic washings had been obtained upon entering the abdominal cavity. Fluid was submitted for cytological evaluation. The pelvic cavity was then copiously irrigated with normal saline solution. The curve Heaney's that were on the angles were secured with a transfixation stitch of 0 Vicryl suture and the remaining vaginal cuff was closed with a figure-of-eight of 0 Vicryl suture. The pelvic cavity was copiously irrigated with normal saline solution. Surgicel was placed near the right side retroperitoneal  space and the retroperitoneum was reapproximated  with a running stitch of 3-0 Vicryl suture. Sponge count needle count were correct and the O'Connor-O'Sullivan retractor was then removed. Patient's position was reversed from Trendelenburg. The visceral peritoneum was not reapproximated. The rectus fascia was closed with a running stitch of 0 Vicryl suture. The subcutaneous tissue was reapproximated with 3-0 Vicryl suture and the skin was reapproximated with. skin staples. The subumbilical incision the fascia was reapproximated with a single stitch of 0 Vicryl suture and the skin was reapproximated with a subcuticular stitch of 3-0 Vicryl suture. For postoperative analgesia a core percent Marcaine for a total 18 cc were infiltrated in both incision sites. Pressure dressings were applied patient was extubated and transferred to recovery room with stable vital signs blood loss was 100 cc urine output 300 cc and clear and she received 2 L of lactated Ringer's. ESTIMATED BLOOD LOSS:100 cc's   Intake/Output Summary (Last 24 hours) at 05/19/11 1451 Last data filed at 05/19/11 1448  Gross per 24 hour  Intake   2500 ml  Output    400 ml  Net   2100 ml     BLOOD ADMINISTERED:none   LOCAL MEDICATIONS USED:  MARCAINE 18CC  SPECIMEN:  Source of Specimen:  Uterus tubes cervix and ovaries  DISPOSITION OF SPECIMEN:  PATHOLOGY  COUNTS:  YES  PLAN OF CARE: Transfer to PACU

## 2011-05-19 NOTE — Anesthesia Postprocedure Evaluation (Signed)
  Anesthesia Post-op Note  Patient: Erica Burch  Procedure(s) Performed:  HYSTERECTOMY ABDOMINAL; SALPINGO OOPHERECTOMY; LAPAROSCOPY DIAGNOSTIC  Patient Location: womens unit  Anesthesia Type: General  Level of Consciousness: alert  and oriented  Airway and Oxygen Therapy: Patient Spontanous Breathing  Post-op Pain: mild  Post-op Assessment: Patient's Cardiovascular Status Stable and Respiratory Function Stable  Post-op Vital Signs: Reviewed and stable  Complications: No apparent anesthesia complications

## 2011-05-19 NOTE — Anesthesia Postprocedure Evaluation (Signed)
  Anesthesia Post-op Note  Patient: Erica Burch  Procedure(s) Performed:  HYSTERECTOMY ABDOMINAL; SALPINGO OOPHERECTOMY; LAPAROSCOPY DIAGNOSTIC  Patient Location: Women's Unit  Anesthesia Type: General  Level of Consciousness: alert , oriented and sedated  Airway and Oxygen Therapy: Patient Spontanous Breathing  Post-op Pain: moderate  Post-op Assessment: Patient's Cardiovascular Status Stable and Respiratory Function Stable  Post-op Vital Signs: Reviewed and stable  Complications: No apparent anesthesia complications

## 2011-05-19 NOTE — H&P (Signed)
No change in patient's medical history in past 24 hours to the best of my knowledge.

## 2011-05-19 NOTE — Anesthesia Preprocedure Evaluation (Signed)
Anesthesia Evaluation  Name, MR# and DOB Patient awake  General Assessment Comment  Reviewed: Allergy & Precautions, H&P , NPO status , Patient's Chart, lab work & pertinent test results  Airway Mallampati: II TM Distance: >3 FB Neck ROM: full    Dental  (+) Teeth Intact   Pulmonary  clear to auscultation  breath sounds clear to auscultation none    Cardiovascular regular     Neuro/Psych Negative Neurological ROS  Negative Psych ROS  GI/Hepatic/Renal negative GI ROS  negative Liver ROS  negative Renal ROS        Endo/Other  Negative Endocrine ROS (+)      Abdominal   Musculoskeletal   Hematology negative hematology ROS (+)   Peds  Reproductive/Obstetrics negative OB ROS    Anesthesia Other Findings             Anesthesia Physical Anesthesia Plan  ASA: II  Anesthesia Plan: General   Post-op Pain Management:    Induction:   Airway Management Planned:   Additional Equipment:   Intra-op Plan:   Post-operative Plan:   Informed Consent: I have reviewed the patients History and Physical, chart, labs and discussed the procedure including the risks, benefits and alternatives for the proposed anesthesia with the patient or authorized representative who has indicated his/her understanding and acceptance.   Dental Advisory Given  Plan Discussed with: Anesthesiologist  Anesthesia Plan Comments:         Anesthesia Quick Evaluation

## 2011-05-20 ENCOUNTER — Encounter (HOSPITAL_COMMUNITY): Payer: Self-pay | Admitting: Gynecology

## 2011-05-20 MED ORDER — DIPHENHYDRAMINE HCL 25 MG PO CAPS
25.0000 mg | ORAL_CAPSULE | Freq: Four times a day (QID) | ORAL | Status: DC | PRN
  Administered 2011-05-20: 25 mg via ORAL
  Filled 2011-05-20: qty 1

## 2011-05-20 MED ORDER — SODIUM CHLORIDE 0.9 % IJ SOLN: 3.0000 mL | Freq: Two times a day (BID) | INTRAMUSCULAR | Status: DC

## 2011-05-20 MED ORDER — LACTATED RINGERS IV SOLN: INTRAVENOUS | Status: DC

## 2011-05-20 MED ORDER — OXYCODONE-ACETAMINOPHEN 5-325 MG PO TABS
1.0000 | ORAL_TABLET | ORAL | Status: DC | PRN
  Administered 2011-05-20 – 2011-05-21 (×4): 2 via ORAL
  Filled 2011-05-20 (×4): qty 2

## 2011-05-20 MED ORDER — ESTRADIOL 0.1 MG/24HR TD PTWK
0.1000 mg | MEDICATED_PATCH | TRANSDERMAL | Status: DC
  Administered 2011-05-20: 0.1 mg via TRANSDERMAL
  Filled 2011-05-20: qty 1

## 2011-05-20 NOTE — Progress Notes (Signed)
1 Day Post-Op Procedure(s): HYSTERECTOMY ABDOMINAL SALPINGO OOPHERECTOMY (Bilateral) LAPAROSCOPY DIAGNOSTIC  Subjective: Patient reports tolerating PO.    Objective: I have reviewed patient's vital signs, intake and output, medications and labs Hgb: 10.5/ HCT 11.9 U/O: >75-100 cc/hour  General: alert and no distress Resp: clear to auscultation bilaterally Cardio: regular rate and rhythm, S1, S2 normal, no murmur, click, rub or gallop GI: soft, non-tender; bowel sounds normal; no masses,  no organomegaly Extremities: extremities normal, atraumatic, no cyanosis or edema Vaginal Bleeding: none  Assessment: s/p Procedure(s): HYSTERECTOMY ABDOMINAL SALPINGO OOPHERECTOMY (Bilateral) LAPAROSCOPY DIAGNOSTIC: stable and progressing well  Plan: Advance diet Encourage ambulation Clear liquids  LOS: 1 day    Erica Burch H 05/20/2011, 11:18 AM

## 2011-05-20 NOTE — Progress Notes (Signed)
UR Chart review completed.  

## 2011-05-21 ENCOUNTER — Encounter: Payer: Self-pay | Admitting: Gynecology

## 2011-05-21 NOTE — Progress Notes (Signed)
2 Days Post-Op Procedure(s): HYSTERECTOMY ABDOMINAL/Bilateral  SALPINGO OOPHERECTOMY LAPAROSCOPY DIAGNOSTIC  Subjective: Patient reports tolerating PO, + flatus and no problems voiding.    Objective: I have reviewed patient's vital signs, intake and output, medications and labs.  General: alert, cooperative and no distress Resp: clear to auscultation bilaterally Cardio: regular rate and rhythm, S1, S2 normal, no murmur, click, rub or gallop GI: soft, non-tender; bowel sounds normal; no masses,  no organomegaly and incision: clean and intact Extremities: extremities normal, atraumatic, no cyanosis or edema Vaginal Bleeding: minimal  Assessment: s/p Procedure(s): HYSTERECTOMY ABDOMINAL/Bilateral SALPINGO OOPHERECTOMY LAPAROSCOPY DIAGNOSTIC: progressing well and tolerating diet Patient was started on Climara transdermal patch 0.1 mg yesterday. We'll transition to oral dose in the office   Plan  Advance diet Discontinue IV fluids Discharge home The patient will return to the office within a week to have her staples removed. She had been prescribed Lortab 7.5/501 by mouth every 4-6 hours when necessary and Reglan 10 mg 1 by mouth every 4-6 hours when necessary.  LOS: 2 days    Jahan Friedlander H 05/21/2011, 8:16 AM

## 2011-05-21 NOTE — Discharge Summary (Signed)
Physician Discharge Summary  Patient ID: Erica Burch MRN: [id number] DOB/AGE: [birth year] 40 y.o.  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 Due to the size of the fibroid encroached in the cul-de-sac and adhered to the right pelvic sidewall the laparoscopic approach was converted to a double abdominal hysterectomy with bilateral salpingo-oophorectomy and pelvic adhesio lysis. Patient had 100 cc of blood loss intraoperatively. She did well postoperatively. She was started on clear liquid diet on her first day postop. Had adequate urine output. After Foley catheter had been removed in and out catheter had to be utilized after 6 hours because she did not into completely. But afterwards she has continued to void spontaneously on her own had been ambulating tolerating regular diet well and this morning she was passing gas and rate to be discharged home. Her incision site was intact. Consults: none  Significant Diagnostic Studies: labs: CBC see yesterday's note. Final pathology report from uterus cervix tubes and ovaries pending at time of this dictation  Treatments: IV hydration, antibiotics: Ancef and surgery: Total abdominal hysterectomy with bilateral salpingo-oophorectomy and pelvic adhesio lysis  Discharge Exam: Blood pressure 102/66, pulse 82, temperature 97.9 F (36.6 C), temperature source Oral, resp. rate 18, last menstrual period 04/28/2009, SpO2 94.00%. General appearance: alert, cooperative and no distress Resp: clear to auscultation bilaterally Cardio:  regular rate and rhythm, S1, S2 normal, no murmur, click, rub or gallop GI: soft, non-tender; bowel sounds normal; no masses,  no organomegaly Extremities: extremities normal, atraumatic, no cyanosis or edema Skin: Skin color, texture, turgor normal. No rashes or lesions Incision/Wound: intact  Disposition:  patient was up ambulating tolerating regular diet well and was read to be discharged home on August 21 she was placed on Climara 0.1 mg transdermal estrogen patch which we will transition point lower oral estrogen as per the recommendations of the medical oncologist. Patient will return to the office at the end of the week to have her staples removed. Patient was voiding spontaneously and was passing gas vital signs were stable and she was afebrile.   Current Discharge Medication List    CONTINUE these medications which have NOT CHANGED   Details  levonorgestrel (MIRENA) 20 MCG/24HR IUD 1 each by Intrauterine route once. Inserted 05/30/09.     zolpidem (AMBIEN) 5 MG tablet Take 5 mg by mouth at bedtime as needed.           SignedOk Edwards 05/21/2011, 8:21 AM  Her labs and cells were also lysed state will

## 2011-05-23 ENCOUNTER — Ambulatory Visit (INDEPENDENT_AMBULATORY_CARE_PROVIDER_SITE_OTHER): Payer: 59 | Admitting: Gynecology

## 2011-05-23 ENCOUNTER — Encounter: Payer: Self-pay | Admitting: Gynecology

## 2011-05-23 VITALS — BP 120/70

## 2011-05-23 DIAGNOSIS — Z9889 Other specified postprocedural states: Secondary | ICD-10-CM

## 2011-05-23 NOTE — Progress Notes (Signed)
In and patient presented to the office today for to have her staples removed she is a 40 year old gravida 3 para 3 with the BRCA1 gene mutation who also had a symptomatic leiomyomatous uteri.After consultation with medical oncologist Dr. Drue Second, it was decided to remove patient's both ovaries at the time of her hysterectomy due to her BRCA1 mutation carrier status which also carries not only the risk of breast cancer but the risk of ovarian cancer at size 40-60%. Because of her surgical menopause and risk of menopausal symptoms at such a young age that she would benefit from a short-term of low dose hormone replacement therapy with estrogen. Patient is fully aware that nothing is absolute and that the risk versus the benefits have been outlined. She fully understands and accepts and will begin to taper her estrogen for the next 2 years but started on low-dose. On postop day 1 she was started on Climara transdermal patch 0.1 mg and she will be started on Vivelle dot 0.075 mg/day twice a  week. Pathology report:    Diagnosis Uterus, ovaries and fallopian tubes, with cervix - BENIGN CERVIX WITH NO SIGNIFICANT ABNORMALITIES. - BENIGN ENDOMETRIUM WITH EXOGENOUS HORMONE EFFECT. - LEIOMYOMATA. - BENIGN OVARIES WITH FOLLICULAR CYSTS. - BENIGN FALLOPIAN TUBES WITH PARATUBAL CYST.  The pathology report was discussed with the patient. She is doing well,  she is up ambulating voiding well tolerating regular diet well. She still has not had a bowel movement. We discussed about spreading out her narcotic and taking more nonsteroidals. Her incision site was intact the staples were removed and the incision was Steri-Stripped. She was instructed to take a Colace tablet in the evening and a tablespoon of MiraLax daily. Samples of Maderma cream was provided for her to apply to incision 2-3 times a day for the next 2-3 months to prevent scarring  in her abdomen from the incision. She will return to the office in 2  weeks for her first postop visit.

## 2011-05-30 ENCOUNTER — Telehealth: Payer: Self-pay | Admitting: *Deleted

## 2011-05-30 NOTE — Telephone Encounter (Signed)
Message copied by Aura Camps on Fri May 30, 2011 10:43 AM ------      Message from: Ok Edwards      Created: Fri May 30, 2011 10:14 AM       Erica Burch, please inform patient she may resume driving if she's not dependent as much on narcotics for pain relief.

## 2011-05-30 NOTE — Telephone Encounter (Signed)
LM ON PT VOICE MAIL REGARDING THE BELOW NOTE.

## 2011-05-30 NOTE — Telephone Encounter (Signed)
PT CALLING TO SEE WHEN SHE WOULD BE ABLE TO START DRIVING AGAIN? SHE HAD HYSTERECTOMY ON AUGUST 20. PLEASE ADVISE.

## 2011-06-03 ENCOUNTER — Encounter: Payer: Self-pay | Admitting: Gynecology

## 2011-06-06 ENCOUNTER — Telehealth: Payer: Self-pay | Admitting: *Deleted

## 2011-06-06 ENCOUNTER — Ambulatory Visit (INDEPENDENT_AMBULATORY_CARE_PROVIDER_SITE_OTHER): Payer: 59 | Admitting: Gynecology

## 2011-06-06 ENCOUNTER — Encounter: Payer: Self-pay | Admitting: Gynecology

## 2011-06-06 VITALS — BP 110/70

## 2011-06-06 DIAGNOSIS — E8940 Asymptomatic postprocedural ovarian failure: Secondary | ICD-10-CM

## 2011-06-06 DIAGNOSIS — Z9889 Other specified postprocedural states: Secondary | ICD-10-CM

## 2011-06-06 MED ORDER — ESTRADIOL 0.075 MG/24HR TD PTTW: 1.0000 | MEDICATED_PATCH | TRANSDERMAL | Status: DC

## 2011-06-06 NOTE — Telephone Encounter (Signed)
Pt called stating that the samples you gave her vivelle dot  patches will run out on Monday. Pt needs rx for patches if she will continue taking these. She is post op hysterectomy, she was seen today and forgot to ask for these. Please advise.

## 2011-06-06 NOTE — Progress Notes (Signed)
Patient 40 year old gravida 3 para 3 presented to the office for her three-week postop visit she status post total abdominal hysterectomy with bilateral salpingo-oophorectomy secondary to symptomatic leiomyomatous uteri and history of BRCA1 gene mutation. We have been in consultation with Dr.Kalsoom Welton Flakes her medical oncologist  who had recommended to proceed at time of her hysterectomy with removal of both ovaries. Also was recommended that she be placed on a low dose estrogen to help with her vasomotor symptoms at such a young age that for a short time. Patient been fully aware that there is no guarantee and the risks benefits and pros and cons of been discussed as well. She is currently on transdermal estrogen therapy consisting of a Vivelle dot 0.075 mg/day which she applies twice a week. Patient is doing well from her surgery has no complaints.  Abdomen: Pfannenstiel intact Pelvic Bartholin urethra Skene was within normal limits Vagina: No lesions or discharge vaginal cuff intact Bimanual exam: No tenderness or masses Adnexa: No palpable mass or tenderness Rectal exam: Not done  Assessment: 3 week postop visit status post TAH/BSO for symptomatic leiomyomatous uteri and BRCA1 carrier on low-dose hormone replacement therapy. Patient doing well will return to the office in 3 weeks for her final postop visit.

## 2011-06-27 ENCOUNTER — Encounter: Payer: Self-pay | Admitting: Gynecology

## 2011-06-27 ENCOUNTER — Ambulatory Visit (INDEPENDENT_AMBULATORY_CARE_PROVIDER_SITE_OTHER): Payer: 59 | Admitting: Gynecology

## 2011-06-27 VITALS — BP 128/86

## 2011-06-27 DIAGNOSIS — Z9889 Other specified postprocedural states: Secondary | ICD-10-CM

## 2011-06-27 NOTE — Patient Instructions (Signed)
Annual 1 year from today

## 2011-06-27 NOTE — Progress Notes (Signed)
Patient 40 year old gravida 3 para 3 who presented to the office her final six-week postop visit she status post total abdominal hysterectomy bilateral salpingo-oophorectomy for symptomatic leiomyomatous uteri and history of BRCA1 gene mutation. Patient is on Vivelle dot 0.075 mg/per day which she applies twice weekly is not complaining of any vasomotor symptoms. See previous notes as to discussion with the medical oncologist Dr. Drue Second.   REPORT OF SURGICAL PATHOLOGY FINAL DIAGNOSIS Diagnosis Uterus, ovaries and fallopian tubes, with cervix - BENIGN CERVIX WITH NO SIGNIFICANT ABNORMALITIES. - BENIGN ENDOMETRIUM WITH EXOGENOUS HORMONE EFFECT. - LEIOMYOMATA. - BENIGN OVARIES WITH FOLLICULAR CYSTS. - BENIGN FALLOPIAN TUBES WITH PARATUBAL CYST. Guerry Bruin MD Pathologist, Electronic Signature (Case signed 05/21/2011)  Exam: Abdomen: Soft nontender no rebound or guarding. Pfannenstiel scar intact. Pelvic exam: Bartholin urethra Skene glands within normal limits Vagina: No lesions or discharge vaginal cuff intact Bimanual examination: Unremarkable no palpable masses or tenderness Rectal: Not done  Assessment and plan: Patient status post total abdominal hysterectomy with bilateral salpingo-oophorectomy 6 weeks ago doing well. Was encouraged to do her monthly self breast examination. She scheduled for her mammogram in December of this year. She will continue on a low dose estrogen replacement therapy a Vivelle dot 0.075mg /day twice a week transdermal application. Patient is fully aware of the risks benefits and pros and cons and light of her history of positive BRCA1 mutation gene and will continue to work and currently with a hematologist oncologist. Maryclare Labrador see the patient in one year or when necessary. Of note we have discussed about her taking calcium 1200 mg daily and vitamin D 2000 units daily. Next year will plan on doing a bone density study.

## 2011-07-15 ENCOUNTER — Other Ambulatory Visit: Payer: Self-pay | Admitting: Oncology

## 2011-07-29 ENCOUNTER — Telehealth: Payer: Self-pay | Admitting: *Deleted

## 2011-07-29 NOTE — Telephone Encounter (Signed)
Pt called stating her insurance will lapse on Oct 31 and she will need a refill on her vivelle dot 0.075 patch. Her new insurace will begin in [place]. Pt was given 2 samples of the vivelle dot 0.075 patch.

## 2011-09-03 ENCOUNTER — Other Ambulatory Visit: Payer: Self-pay | Admitting: Gynecology

## 2011-09-03 DIAGNOSIS — Z1231 Encounter for screening mammogram for malignant neoplasm of breast: Secondary | ICD-10-CM

## 2011-10-08 ENCOUNTER — Ambulatory Visit
Admission: RE | Admit: 2011-10-08 | Discharge: 2011-10-08 | Disposition: A | Discharge status: Home / Self Care | Payer: Self-pay | Source: Ambulatory Visit | Attending: Gynecology | Admitting: Gynecology

## 2011-10-08 DIAGNOSIS — Z1231 Encounter for screening mammogram for malignant neoplasm of breast: Secondary | ICD-10-CM

## 2012-07-02 ENCOUNTER — Encounter: Payer: Self-pay | Admitting: Gynecology

## 2012-07-02 ENCOUNTER — Ambulatory Visit (INDEPENDENT_AMBULATORY_CARE_PROVIDER_SITE_OTHER): Payer: BC Managed Care – PPO | Admitting: Gynecology

## 2012-07-02 VITALS — BP 124/80 | Ht 64.0 in | Wt 147.0 lb

## 2012-07-02 DIAGNOSIS — Z803 Family history of malignant neoplasm of breast: Secondary | ICD-10-CM

## 2012-07-02 DIAGNOSIS — Z23 Encounter for immunization: Secondary | ICD-10-CM

## 2012-07-02 DIAGNOSIS — E8940 Asymptomatic postprocedural ovarian failure: Secondary | ICD-10-CM

## 2012-07-02 DIAGNOSIS — Z01419 Encounter for gynecological examination (general) (routine) without abnormal findings: Secondary | ICD-10-CM

## 2012-07-02 DIAGNOSIS — B009 Herpesviral infection, unspecified: Secondary | ICD-10-CM | POA: Insufficient documentation

## 2012-07-02 DIAGNOSIS — Z1501 Genetic susceptibility to malignant neoplasm of breast: Secondary | ICD-10-CM

## 2012-07-02 DIAGNOSIS — Z833 Family history of diabetes mellitus: Secondary | ICD-10-CM

## 2012-07-02 DIAGNOSIS — E8941 Symptomatic postprocedural ovarian failure: Secondary | ICD-10-CM

## 2012-07-02 LAB — CBC WITH DIFFERENTIAL/PLATELET
Basophils Absolute: 0 10*3/uL (ref 0.0–0.1)
Lymphocytes Relative: 43 % (ref 12–46)
Lymphs Abs: 2 10*3/uL (ref 0.7–4.0)
Neutro Abs: 2.2 10*3/uL (ref 1.7–7.7)
Platelets: 264 10*3/uL (ref 150–400)
RBC: 4.91 MIL/uL (ref 3.87–5.11)
RDW: 13.8 % (ref 11.5–15.5)
WBC: 4.7 10*3/uL (ref 4.0–10.5)

## 2012-07-02 LAB — LIPID PANEL: Cholesterol: 194 mg/dL (ref 0–200)

## 2012-07-02 LAB — GLUCOSE, RANDOM: Glucose, Bld: 81 mg/dL (ref 70–99)

## 2012-07-02 LAB — TSH: TSH: 2.548 u[IU]/mL (ref 0.350–4.500)

## 2012-07-02 NOTE — Progress Notes (Signed)
Erica Burch 1970-10-19 [id number]   History:    41 y.o.  for annual gyn exam who in 2012 had a total abdominal hysterectomy and bilateral salpingo-oophorectomy do to her strong family history of primary breast cancer and personal history of positive BRCA1 mutation. Patient has seen Dr. Odie Sera last year for consideration of prophylactic mastectomy and still has not decided. After consultation with her oncologist Dr.Khan, she had been placed on a very low dose transdermal estrogen consisting of Vivelle-Dot 0.075 mg transdermally but patient stated that she used only for 5 months and discontinued it. Patient states that her vasomotor symptoms are very far and in between. She does have some vaginal dryness. She is taking calcium with vitamin D twice a day. Patient with normal Pap smear in 2012. Patient with no prior history of abnormal Pap smears. Patient with strong family history also of diabetes whereby her father is non-insulin-dependent diabetic. Patient's last mammogram was in January of this year which was normal. Patient does her monthly self breast examination. Patient denies any family history of any GI malignancy. Pathology report from her total abdominal hysterectomy bilateral salpingo-oophorectomy in 2012 as follows:  Diagnosis Uterus, ovaries and fallopian tubes, with cervix - BENIGN CERVIX WITH NO SIGNIFICANT ABNORMALITIES. - BENIGN ENDOMETRIUM WITH EXOGENOUS HORMONE EFFECT. - LEIOMYOMATA. - BENIGN OVARIES WITH FOLLICULAR CYSTS. - BENIGN FALLOPIAN TUBES WITH PARATUBAL CYST.   Past medical history,surgical history, family history and social history were all reviewed and documented in the EPIC chart.  Gynecologic History Patient's last menstrual period was 04/28/2009. Contraception: Hysterectomy Last Pap: 2012. Results were: normal Last mammogram: 2013. Results were: normal  Obstetric History OB History    Grav Para Term Preterm Abortions TAB SAB Ect Mult Living   3 3 3              # Outc Date GA Lbr Len/2nd Wgt Sex Del Anes PTL Lv   1 TRM     F SVD      2 TRM     F SVD      3 TRM     M SVD          ROS: A ROS was performed and pertinent positives and negatives are included in the history.  GENERAL: No fevers or chills. HEENT: No change in vision, no earache, sore throat or sinus congestion. NECK: No pain or stiffness. CARDIOVASCULAR: No chest pain or pressure. No palpitations. PULMONARY: No shortness of breath, cough or wheeze. GASTROINTESTINAL: No abdominal pain, nausea, vomiting or diarrhea, melena or bright red blood per rectum. GENITOURINARY: No urinary frequency, urgency, hesitancy or dysuria. MUSCULOSKELETAL: No joint or muscle pain, no back pain, no recent trauma. DERMATOLOGIC: No rash, no itching, no lesions. ENDOCRINE: No polyuria, polydipsia, no heat or cold intolerance. No recent change in weight. HEMATOLOGICAL: No anemia or easy bruising or bleeding. NEUROLOGIC: No headache, seizures, numbness, tingling or weakness. PSYCHIATRIC: No depression, no loss of interest in normal activity or change in sleep pattern.     Exam: chaperone present  BP 124/80  Ht 5\' 4"  (1.626 m)  Wt 147 lb (66.679 kg)  BMI 25.23 kg/m2  LMP 04/28/2009  Body mass index is 25.23 kg/(m^2).  General appearance : Well developed well nourished female. No acute distress HEENT: Neck supple, trachea midline, no carotid bruits, no thyroidmegaly Lungs: Clear to auscultation, no rhonchi or wheezes, or rib retractions  Heart: Regular rate and rhythm, no murmurs or gallops Breast:Examined in sitting and supine position were symmetrical in  appearance, no palpable masses or tenderness,  no skin retraction, no nipple inversion, no nipple discharge, no skin discoloration, no axillary or supraclavicular lymphadenopathy Abdomen: no palpable masses or tenderness, no rebound or guarding Extremities: no edema or skin discoloration or tenderness  Pelvic:  Bartholin, Urethra, Skene Glands:  Within normal limits             Vagina: No gross lesions or discharge  Cervix: Absent  Uterus absent  Adnexa  Without masses or tenderness  Anus and perineum  normal   Rectovaginal  normal sphincter tone without palpated masses or tenderness             Hemoccult cards provided for patient to submit to the office for testing     Assessment/Plan:  41 y.o. female for annual exam status post total abdominal hysterectomy with bilateral salpingo-oophorectomy in 2012 as a result of strong first-line relatives with breast cancer and patient herself with positive BRCA1 gene mutation. Patient is still pondering idea of proceeding with prophylactic mastectomy. Patient CA 125 was normal preop last year. We had discussed before her surgery that based on her being a BRCA1 carrier she had a 60% risk of developing ovarian cancer. We had also discussed my conversation with her oncologist who had stated preoperatively that her risk of breast cancer would be reduced by 30-40% by removing her ovaries. Last year she had spoken with the general surgeon Dr.Hoxsworth who had quoted her that with her history prophylactic mastectomy with reduce her risk of breast cancer by 90%. She is fully aware that having the BRCA1 gene mutation she has a 75-80% risk of breast cancer. She is taking this under consideration and we will check on her insurance coverage and her out-of-pocket expense. She will continue to do her monthly breast examinations. She will followup with her mammogram in January and I have recommended she do a 3-D mammogram. We will check a CA 125 today along with her CBC, TSH, fasting lipid profile, and fasting blood sugar. New Pap smear screening guidelines discussed. No Pap smear done today. For her vaginal dryness she will be placed on Hyalo Gyn vaginal hydrating gel to reduce the symptoms of vaginal dryness and she may apply it 3 times a week and it is nonhormonal. She was admitted to the office the Hemoccult cards  for testing. Next year will do a baseline bone density study. She was instructed to continue to take her calcium and vitamin D for osteoporosis prevention as well as weightbearing exercises 3-4 times a week. She will followup with the general surgeon within the next year to proceed with her prophylactic mastectomy.   Ok Edwards MD, 11:11 AM 07/02/2012

## 2012-07-02 NOTE — Patient Instructions (Addendum)
Diphtheria, Tetanus, and Pertussis (DTaP) Vaccine What You Need to Know WHY GET VACCINATED? Diphtheria, tetanus, and pertussis are serious diseases caused by bacteria. Diphtheria and pertussis are spread from person to person. Tetanus enters the body through cuts or wounds. Diphtheria causes a thick covering in the back of the throat.  It can lead to breathing problems, paralysis, heart failure, and even death. Tetanus (Lockjaw) causes painful tightening of the muscles, usually all over the body.  It can lead to "locking" of the jaw so the victim cannot open his or her mouth or swallow. Tetanus leads to death in about 2 out of 10 cases. Pertussis (Whooping Cough) causes coughing spells so bad that it is hard for infants to eat, drink, or breathe. These spells can last for weeks.  It can lead to pneumonia, seizures (jerking and staring spells), brain damage, and death. Diphtheria, tetanus, and pertussis vaccine (DTaP) can help prevent these diseases. Most children who are vaccinated with DTaP will be protected throughout childhood. Many more children would get these diseases if we stopped vaccinating. DTaP is a safer version of an older vaccine called DTP. DTP is no longer used in the [place]. WHO SHOULD GET DTAP VACCINE AND WHEN? Children should get 5 doses of DTaP vaccine, 1 dose at each of the following ages:  2 months.  4 months.  6 months.  15 to 18 months.  4 to 6 years. DTaP may be given at the same time as other vaccines. SOME CHILDREN SHOULD NOT GET DTAP VACCINE OR SHOULD WAIT  Children with minor illnesses, such as a cold, may be vaccinated. But children who are moderately or severely ill should usually wait until they recover before getting DTaP vaccine.  Any child who had a life-threatening allergic reaction after a dose of DTaP should not get another dose.  Any child who suffered a brain or nervous system disease within 7 days after a dose of DTaP should not get  another dose.  Talk with your caregiver if your child:  Had a seizure or collapsed after a dose of DTaP.  Cried non-stop for 3 hours or more after a dose of DTaP.  Had a fever over 105 F (40.6 C) after a dose of DTaP.  Ask your caregiver for more information. Some of these children should not get another dose of pertussis vaccine, but may get a vaccine without pertussis, called DT. OLDER CHILDREN AND ADULTS  DTaP is not licensed for adolescents, adults, or children 7 years of age and older.  But older people still need protection. A vaccine called Tdap is similar to DTaP. A single dose of Tdap is recommended for people 11 through 40 years of age. Another vaccine, called Td, protects against tetanus and diphtheria, but not pertussis. It is recommended every 10 years. WHAT ARE THE RISKS FROM DTAP VACCINE?  Getting diphtheria, tetanus, or pertussis disease is much riskier than getting DTaP vaccine.  However, a vaccine, like any medicine, is capable of causing serious problems, such as severe allergic reactions. The risk of DTaP vaccine causing serious harm, or death, is extremely small. Mild Problems (Common)  Fever (up to about 1 child in 4).  Redness or swelling where the shot was given (up to about 1 child in 4).  Soreness or tenderness where the shot was given (up to about 1 child in 4). These problems occur more often after the 4th and 5th doses of the DTaP series than after earlier doses. Sometimes the 4th   or 5th dose of DTaP vaccine is followed by swelling of the entire arm or leg in which the shot was given, lasting 1 to 7 days (up to about 1 child in 30). Other mild problems include:  Fussiness (up to about 1 child in 3).  Tiredness or poor appetite (up to about 1 child in 10).  Vomiting (up to about 1 child in 50). These problems generally occur 1 to 3 days after the shot. Moderate Problems (Uncommon)  Seizure (jerking or staring) (about 1 child out of  14,000).  Non-stop crying, for 3 hours or more (up to about 1 child out of 1,000).  High fever, over 105 F (40.6 C) (about 1 child out of 16,000). Severe Problems (Very Rare)  Serious allergic reaction (less than 1 out of a million doses).  Several other severe problems have been reported after DTaP vaccine. These include:  Long-term seizures, coma, or lowered consciousness.  Permanent brain damage. These are so rare it is hard to tell if they are caused by the vaccine. Controlling fever is especially important for children who have had seizures, for any reason. It is also important if another family member has had seizures. You can reduce fever and pain by giving your child an aspirin-free pain reliever when the shot is given, and for the next 24 hours, following the package instructions. WHAT IF THERE IS A MODERATE OR SEVERE REACTION? What should I look for? Any unusual conditions, such as a serious allergic reaction, high fever, or unusual behavior. Serious allergic reactions are extremely rare with any vaccine. If one were to occur, it would most likely be within a few minutes to a few hours after the shot. Signs can include difficulty breathing, hoarseness or wheezing, hives, paleness, weakness, a fast heartbeat, or dizziness. If a high fever or seizure were to occur, it would usually be within a week after the shot. What should I do?  Call your caregiver or get the person to a caregiver right away.  Tell the caregiver what happened, the date and time it happened, and when the vaccination was given.  Ask the caregiver, nurse, or health department to file a Vaccine Adverse Event Reporting System (VAERS) form. Or, you can file this report through the VAERS website at www.vaers.hhs.gov or by calling 1-800-822-7967. VAERS does not provide medical advice. THE NATIONAL VACCINE INJURY COMPENSATION PROGRAM  In the rare event that you or your child has a serious reaction to a vaccine, a  federal program has been created to help you pay for the care of those who have been harmed.  For details about the National Vaccine Injury Compensation Program, call 1-800-338-2382 or visit the program's website at www.hrsa.gov/vaccinecompensation HOW CAN I LEARN MORE?  Ask your caregiver. They can give you the vaccine package insert or suggest other sources of information.  Call your local or state health department's immunization program.  Contact the Centers for Disease Control and Prevention (CDC):  Call 1-800-232-4636 (1-800-CDC-INFO).  Visit the National Immunization Program's website at www.cdc.gov/vaccines CDC Diphtheria, Tetanus, and Pertussis (DTaP) Vaccine VIS (02/12/06) Document Released: 07/13/2006 Document Revised: 12/08/2011 Document Reviewed: 07/13/2006 [employer] Patient Information 2013 [employer].  

## 2012-07-03 LAB — URINALYSIS W MICROSCOPIC + REFLEX CULTURE
Bilirubin Urine: NEGATIVE
Casts: NONE SEEN
Crystals: NONE SEEN
Glucose, UA: NEGATIVE mg/dL
Specific Gravity, Urine: 1.011 (ref 1.005–1.030)
Squamous Epithelial / LPF: NONE SEEN
Urobilinogen, UA: 0.2 mg/dL (ref 0.0–1.0)
pH: 7 (ref 5.0–8.0)

## 2012-07-04 LAB — URINE CULTURE

## 2012-07-05 ENCOUNTER — Other Ambulatory Visit: Payer: Self-pay | Admitting: Gynecology

## 2012-07-05 DIAGNOSIS — N390 Urinary tract infection, site not specified: Secondary | ICD-10-CM

## 2012-07-05 MED ORDER — AMPICILLIN 250 MG PO CAPS: 250.0000 mg | ORAL_CAPSULE | Freq: Two times a day (BID) | ORAL | Status: DC

## 2012-07-08 ENCOUNTER — Encounter: Payer: Self-pay | Admitting: Gynecology

## 2012-08-09 ENCOUNTER — Other Ambulatory Visit: Payer: BC Managed Care – PPO

## 2012-08-09 DIAGNOSIS — N390 Urinary tract infection, site not specified: Secondary | ICD-10-CM

## 2012-08-10 LAB — URINALYSIS W MICROSCOPIC + REFLEX CULTURE
Bacteria, UA: NONE SEEN
Bilirubin Urine: NEGATIVE
Casts: NONE SEEN
Glucose, UA: NEGATIVE mg/dL
Hgb urine dipstick: NEGATIVE
Ketones, ur: NEGATIVE mg/dL
pH: 7 (ref 5.0–8.0)

## 2012-10-04 ENCOUNTER — Other Ambulatory Visit: Payer: Self-pay | Admitting: Gynecology

## 2012-10-04 DIAGNOSIS — Z1231 Encounter for screening mammogram for malignant neoplasm of breast: Secondary | ICD-10-CM

## 2012-10-29 ENCOUNTER — Ambulatory Visit
Admission: RE | Admit: 2012-10-29 | Discharge: 2012-10-29 | Disposition: A | Discharge status: Home / Self Care | Payer: BC Managed Care – PPO | Source: Ambulatory Visit | Attending: Gynecology | Admitting: Gynecology

## 2012-10-29 DIAGNOSIS — Z1231 Encounter for screening mammogram for malignant neoplasm of breast: Secondary | ICD-10-CM

## 2013-07-04 ENCOUNTER — Encounter: Payer: BC Managed Care – PPO | Admitting: Gynecology

## 2013-07-06 ENCOUNTER — Ambulatory Visit (INDEPENDENT_AMBULATORY_CARE_PROVIDER_SITE_OTHER): Payer: BC Managed Care – PPO | Admitting: Gynecology

## 2013-07-06 ENCOUNTER — Encounter: Payer: Self-pay | Admitting: Gynecology

## 2013-07-06 VITALS — BP 150/94 | Ht 64.5 in | Wt 148.0 lb

## 2013-07-06 DIAGNOSIS — E8940 Asymptomatic postprocedural ovarian failure: Secondary | ICD-10-CM

## 2013-07-06 DIAGNOSIS — Z01419 Encounter for gynecological examination (general) (routine) without abnormal findings: Secondary | ICD-10-CM

## 2013-07-06 DIAGNOSIS — Z1501 Genetic susceptibility to malignant neoplasm of breast: Secondary | ICD-10-CM

## 2013-07-06 DIAGNOSIS — E8941 Symptomatic postprocedural ovarian failure: Secondary | ICD-10-CM

## 2013-07-06 DIAGNOSIS — Z23 Encounter for immunization: Secondary | ICD-10-CM

## 2013-07-06 LAB — CBC WITH DIFFERENTIAL/PLATELET
Basophils Absolute: 0 10*3/uL (ref 0.0–0.1)
Basophils Relative: 1 % (ref 0–1)
Eosinophils Relative: 1 % (ref 0–5)
Lymphocytes Relative: 48 % — ABNORMAL HIGH (ref 12–46)
Neutro Abs: 1.6 10*3/uL — ABNORMAL LOW (ref 1.7–7.7)
Platelets: 265 10*3/uL (ref 150–400)
RDW: 13.6 % (ref 11.5–15.5)
WBC: 3.6 10*3/uL — ABNORMAL LOW (ref 4.0–10.5)

## 2013-07-06 LAB — HEMOGLOBIN A1C: Mean Plasma Glucose: 103 mg/dL (ref <117)

## 2013-07-06 NOTE — Progress Notes (Signed)
Erica Burch 09-07-1971 [id number]   History:    42 y.o.  for annual gyn exam who in 2012 had a total abdominal hysterectomy and bilateral salpingo-oophorectomy do to her strong family history of primary breast cancer and personal history of positive BRCA1 mutation. Patient has seen Dr. Odie Sera in 242for consideration of prophylactic mastectomy and still has not decided. After consultation with her oncologist Dr.Khan, she had been placed on a very low dose transdermal estrogen consisting of Vivelle-Dot 0.075 mg transdermally but patient stated that she used only for 5 months and discontinued it. Patient states that her vasomotor symptoms are very far and in between. She does have some vaginal dryness. She is taking calcium with vitamin D twice a day. Patient with normal Pap smear in 2012. Patient with no prior history of abnormal Pap smears. Patient with strong family history also of diabetes whereby her father is non-insulin-dependent diabetic. Patient's last mammogram was in January of this year which was normal. Patient does her monthly self breast examination. Patient denies any family history of any GI malignancy.    Past medical history,surgical history, family history and social history were all reviewed and documented in the EPIC chart.  Gynecologic History Patient's last menstrual period was 04/28/2009. Contraception: status post hysterectomy Last Pap: 2012. Results were: normal Last mammogram: 2014. Results were: normal  Obstetric History OB History  Gravida Para Term Preterm AB SAB TAB Ectopic Multiple Living  3 3 3            # Outcome Date GA Lbr Len/2nd Weight Sex Delivery Anes PTL Lv  3 TRM     M SVD     2 TRM     F SVD     1 TRM     F SVD          ROS: A ROS was performed and pertinent positives and negatives are included in the history.  GENERAL: No fevers or chills. HEENT: No change in vision, no earache, sore throat or sinus congestion. NECK: No pain or  stiffness. CARDIOVASCULAR: No chest pain or pressure. No palpitations. PULMONARY: No shortness of breath, cough or wheeze. GASTROINTESTINAL: No abdominal pain, nausea, vomiting or diarrhea, melena or bright red blood per rectum. GENITOURINARY: No urinary frequency, urgency, hesitancy or dysuria. MUSCULOSKELETAL: No joint or muscle pain, no back pain, no recent trauma. DERMATOLOGIC: No rash, no itching, no lesions. ENDOCRINE: No polyuria, polydipsia, no heat or cold intolerance. No recent change in weight. HEMATOLOGICAL: No anemia or easy bruising or bleeding. NEUROLOGIC: No headache, seizures, numbness, tingling or weakness. PSYCHIATRIC: No depression, no loss of interest in normal activity or change in sleep pattern.     Exam: chaperone present  BP 150/94  Ht 5' 4.5" (1.638 m)  Wt 148 lb (67.132 kg)  BMI 25.02 kg/m2  LMP 04/28/2009  Body mass index is 25.02 kg/(m^2).  General appearance : Well developed well nourished female. No acute distress HEENT: Neck supple, trachea midline, no carotid bruits, no thyroidmegaly Lungs: Clear to auscultation, no rhonchi or wheezes, or rib retractions  Heart: Regular rate and rhythm, no murmurs or gallops Breast:Examined in sitting and supine position were symmetrical in appearance, no palpable masses or tenderness,  no skin retraction, no nipple inversion, no nipple discharge, no skin discoloration, no axillary or supraclavicular lymphadenopathy Abdomen: no palpable masses or tenderness, no rebound or guarding Extremities: no edema or skin discoloration or tenderness  Pelvic:  Bartholin, Urethra, Skene Glands: Within normal limits  Vagina: No gross lesions or discharge  Cervix: absent  Uterus Absence  Adnexa  Without masses or tenderness  Anus and perineum  normal   Rectovaginal  normal sphincter tone without palpated masses or tenderness             Hemoccult card provided     Assessment/Plan:  42 y.o. female fstatus post total  abdominal hysterectomy with bilateral salpingo-oophorectomy in 2012 as a result of strong first-line relatives with breast cancer and patient herself with positive BRCA1 gene mutation. Patient is still pondering idea of proceeding with prophylactic mastectomy. Patient CA 125 was normal in 2013.We had discussed before her surgery that based on her being a BRCA1 carrier she had a 60% risk of developing ovarian cancer. We had also discussed my conversation with her oncologist who had stated preoperatively that her risk of breast cancer would be reduced by 30-40% by removing her ovaries. Last year she had spoken with the general surgeon Dr.Hoxsworth who had quoted her that with her history prophylactic mastectomy with reduce her risk of breast cancer by 90%. She is fully aware that having the BRCA1 gene mutation she has a 75-80% risk of breast cancer. She is taking this under consideration and we will check on her insurance coverage and her out-of-pocket expense. She will continue to do her monthly breast examinations. Patient with normal 3 the mammogram this year.We will check a CA 125 today along with her CBC, TSH, screening cholesterol and hemoglobin A1c.New Pap smear screening guidelines discussed. No Pap smear done today. For her vaginal dryness she will be placed on Hyalo Gyn vaginal hydrating gel to reduce the symptoms of vaginal dryness and she may apply it 3 times a week and it is nonhormonal. She was provided with Hemoccult cards to submit to the office for testing. She will be scheduled for a baseline bone density study.She was instructed to continue to take her calcium and vitamin D for osteoporosis prevention as well as weightbearing exercises 3-4 times a week. She will followup with the general surgeon within the next year to proceed with her prophylactic mastectomy.   Of note patient had a left breast lumpectomy in the year 2000 Which was benign.  Note: This dictation was prepared with   Dragon/digital dictation along withSmart phrase technology. Any transcriptional errors that result from this process are unintentional.   Ok Edwards MD, 12:21 PM 07/06/2013

## 2013-07-06 NOTE — Patient Instructions (Signed)
Influenza Vaccine (Flu Vaccine, Inactivated) 2013 2014 What You Need to Know WHY GET VACCINATED?  Influenza ("flu") is a contagious disease that spreads around the [place] every winter, usually between October and May.  Flu is caused by the influenza virus, and can be spread by coughing, sneezing, and close contact.  Anyone can get flu, but the risk of getting flu is highest among children. Symptoms come on suddenly and may last several days. They can include:  Fever or chills.  Sore throat.  Muscle aches.  Fatigue.  Cough.  Headache.  Runny or stuffy nose. Flu can make some people much sicker than others. These people include young children, people 65 and older, pregnant women, and people with certain health conditions such as heart, lung or kidney disease, or a weakened immune system. Flu vaccine is especially important for these people, and anyone in close contact with them. Flu can also lead to pneumonia, and make existing medical conditions worse. It can cause diarrhea and seizures in children. Each year thousands of people in the [place] die from flu, and many more are hospitalized. Flu vaccine is the best protection we have from flu and its complications. Flu vaccine also helps prevent spreading flu from person to person. INACTIVATED FLU VACCINE There are 2 types of influenza vaccine:  You are getting an inactivated flu vaccine, which does not contain any live influenza virus. It is given by injection with a needle, and often called the "flu shot."  A different live, attenuated (weakened) influenza vaccine is sprayed into the nostrils. This vaccine is described in a separate Vaccine Information Statement. Flu vaccine is recommended every year. Children 6 months through 8 years of age should get 2 doses the first year they get vaccinated. Flu viruses are always changing. Each year's flu vaccine is made to protect from viruses that are most likely to cause disease  that year. While flu vaccine cannot prevent all cases of flu, it is our best defense against the disease. Inactivated flu vaccine protects against 3 or 4 different influenza viruses. It takes about 2 weeks for protection to develop after the vaccination, and protection lasts several months to a year. Some illnesses that are not caused by influenza virus are often mistaken for flu. Flu vaccine will not prevent these illnesses. It can only prevent influenza. A "high-dose" flu vaccine is available for people 65 years of age and older. The person giving you the vaccine can tell you more about it. Some inactivated flu vaccine contains a very small amount of a mercury-based preservative called thimerosal. Studies have shown that thimerosal in vaccines is not harmful, but flu vaccines that do not contain a preservative are available. SOME PEOPLE SHOULD NOT GET THIS VACCINE Tell the person who gives you the vaccine:  If you have any severe (life-threatening) allergies. If you ever had a life-threatening allergic reaction after a dose of flu vaccine, or have a severe allergy to any part of this vaccine, you may be advised not to get a dose. Most, but not all, types of flu vaccine contain a small amount of egg.  If you ever had Guillain Barr Syndrome (a severe paralyzing illness, also called GBS). Some people with a history of GBS should not get this vaccine. This should be discussed with your doctor.  If you are not feeling well. They might suggest waiting until you feel better. But you should come back. RISKS OF A VACCINE REACTION With a vaccine, like any medicine, there   is a chance of side effects. These are usually mild and go away on their own. Serious side effects are also possible, but are very rare. Inactivated flu vaccine does not contain live flu virus, sogetting flu from this vaccine is not possible. Brief fainting spells and related symptoms (such as jerking movements) can happen after any medical  procedure, including vaccination. Sitting or lying down for about 15 minutes after a vaccination can help prevent fainting and injuries caused by falls. Tell your doctor if you feel dizzy or lightheaded, or have vision changes or ringing in the ears. Mild problems following inactivated flu vaccine:  Soreness, redness, or swelling where the shot was given.  Hoarseness; sore, red or itchy eyes; or cough.  Fever.  Aches.  Headache.  Itching.  Fatigue. If these problems occur, they usually begin soon after the shot and last 1 or 2 days. Moderate problems following inactivated flu vaccine:  Young children who get inactivated flu vaccine and pneumococcal vaccine (PCV13) at the same time may be at increased risk for seizures caused by fever. Ask your doctor for more information. Tell your doctor if a child who is getting flu vaccine has ever had a seizure. Severe problems following inactivated flu vaccine:  A severe allergic reaction could occur after any vaccine (estimated less than 1 in a million doses).  There is a small possibility that inactivated flu vaccine could be associated with Guillan Barr Syndrome (GBS), no more than 1 or 2 cases per million people vaccinated. This is much lower than the risk of severe complications from flu, which can be prevented by flu vaccine. The safety of vaccines is always being monitored. For more information, visit: www.cdc.gov/vaccinesafety/ WHAT IF THERE IS A SERIOUS REACTION? What should I look for?  Look for anything that concerns you, such as signs of a severe allergic reaction, very high fever, or behavior changes. Signs of a severe allergic reaction can include hives, swelling of the face and throat, difficulty breathing, a fast heartbeat, dizziness, and weakness. These would start a few minutes to a few hours after the vaccination. What should I do?  If you think it is a severe allergic reaction or other emergency that cannot wait, call 9 1 1  or get the person to the nearest hospital. Otherwise, call your doctor.  Afterward, the reaction should be reported to the Vaccine Adverse Event Reporting System (VAERS). Your doctor might file this report, or you can do it yourself through the VAERS website at www.vaers.hhs.gov, or by calling 1-800-822-7967. VAERS is only for reporting reactions. They do not give medical advice. THE NATIONAL VACCINE INJURY COMPENSATION PROGRAM The National Vaccine Injury Compensation Program (VICP) is a federal program that was created to compensate people who may have been injured by certain vaccines. Persons who believe they may have been injured by a vaccine can learn about the program and about filing a claim by calling 1-800-338-2382 or visiting the VICP website at www.hrsa.gov/vaccinecompensation HOW CAN I LEARN MORE?  Ask your doctor.  Call your local or state health department.  Contact the Centers for Disease Control and Prevention (CDC):  Call 1-800-232-4636 (1-800-CDC-INFO) or  Visit CDC's website at www.cdc.gov/flu CDC Inactivated Influenza Vaccine Interim VIS (04/23/12) Document Released: 07/10/2006 Document Revised: 06/09/2012 Document Reviewed: 04/23/2012 [employer] Patient Information 2014 [employer].  

## 2013-07-07 LAB — URINALYSIS W MICROSCOPIC + REFLEX CULTURE
Bacteria, UA: NONE SEEN
Hgb urine dipstick: NEGATIVE
Nitrite: NEGATIVE
Protein, ur: NEGATIVE mg/dL
Specific Gravity, Urine: 1.01 (ref 1.005–1.030)

## 2013-07-07 LAB — TSH: TSH: 2.011 u[IU]/mL (ref 0.350–4.500)

## 2013-07-07 LAB — CA 125: CA 125: 6.3 U/mL (ref 0.0–30.2)

## 2013-07-08 ENCOUNTER — Other Ambulatory Visit: Payer: Self-pay | Admitting: Gynecology

## 2013-07-08 LAB — URINE CULTURE: Colony Count: 50000

## 2013-07-08 MED ORDER — AMPICILLIN 250 MG PO CAPS: 250.0000 mg | ORAL_CAPSULE | Freq: Two times a day (BID) | ORAL | Status: DC

## 2013-08-16 ENCOUNTER — Ambulatory Visit (INDEPENDENT_AMBULATORY_CARE_PROVIDER_SITE_OTHER): Payer: BC Managed Care – PPO

## 2013-08-16 ENCOUNTER — Other Ambulatory Visit: Payer: Self-pay | Admitting: Gynecology

## 2013-08-16 DIAGNOSIS — E8940 Asymptomatic postprocedural ovarian failure: Secondary | ICD-10-CM

## 2013-08-16 DIAGNOSIS — Z13820 Encounter for screening for osteoporosis: Secondary | ICD-10-CM

## 2013-08-16 DIAGNOSIS — E8941 Symptomatic postprocedural ovarian failure: Secondary | ICD-10-CM

## 2013-10-11 ENCOUNTER — Other Ambulatory Visit: Payer: Self-pay

## 2013-10-11 DIAGNOSIS — Z1231 Encounter for screening mammogram for malignant neoplasm of breast: Secondary | ICD-10-CM

## 2013-11-09 ENCOUNTER — Ambulatory Visit
Admission: RE | Admit: 2013-11-09 | Discharge: 2013-11-09 | Disposition: A | Discharge status: Home / Self Care | Payer: BC Managed Care – PPO | Source: Ambulatory Visit

## 2013-11-09 DIAGNOSIS — Z1231 Encounter for screening mammogram for malignant neoplasm of breast: Secondary | ICD-10-CM

## 2014-07-14 ENCOUNTER — Other Ambulatory Visit: Payer: Self-pay

## 2014-07-20 ENCOUNTER — Encounter: Payer: Self-pay | Admitting: Gynecology

## 2014-07-20 ENCOUNTER — Ambulatory Visit (INDEPENDENT_AMBULATORY_CARE_PROVIDER_SITE_OTHER): Payer: BC Managed Care – PPO | Admitting: Gynecology

## 2014-07-20 VITALS — BP 122/84 | Ht 64.5 in | Wt 145.0 lb

## 2014-07-20 DIAGNOSIS — Z1501 Genetic susceptibility to malignant neoplasm of breast: Secondary | ICD-10-CM

## 2014-07-20 DIAGNOSIS — N952 Postmenopausal atrophic vaginitis: Secondary | ICD-10-CM

## 2014-07-20 DIAGNOSIS — E8940 Asymptomatic postprocedural ovarian failure: Secondary | ICD-10-CM

## 2014-07-20 DIAGNOSIS — N958 Other specified menopausal and perimenopausal disorders: Secondary | ICD-10-CM

## 2014-07-20 DIAGNOSIS — Z1509 Genetic susceptibility to other malignant neoplasm: Secondary | ICD-10-CM

## 2014-07-20 DIAGNOSIS — Z833 Family history of diabetes mellitus: Secondary | ICD-10-CM

## 2014-07-20 DIAGNOSIS — Z01419 Encounter for gynecological examination (general) (routine) without abnormal findings: Secondary | ICD-10-CM

## 2014-07-20 LAB — CBC WITH DIFFERENTIAL/PLATELET
Basophils Absolute: 0 10*3/uL (ref 0.0–0.1)
Basophils Relative: 1 % (ref 0–1)
Eosinophils Absolute: 0 10*3/uL (ref 0.0–0.7)
Eosinophils Relative: 1 % (ref 0–5)
HCT: 37.5 % (ref 36.0–46.0)
HEMOGLOBIN: 12.6 g/dL (ref 12.0–15.0)
LYMPHS PCT: 45 % (ref 12–46)
Lymphs Abs: 1.9 10*3/uL (ref 0.7–4.0)
MCH: 28.3 pg (ref 26.0–34.0)
MCHC: 33.6 g/dL (ref 30.0–36.0)
MCV: 84.3 fL (ref 78.0–100.0)
MONOS PCT: 10 % (ref 3–12)
Monocytes Absolute: 0.4 10*3/uL (ref 0.1–1.0)
Neutro Abs: 1.8 10*3/uL (ref 1.7–7.7)
Neutrophils Relative %: 43 % (ref 43–77)
Platelets: 264 10*3/uL (ref 150–400)
RBC: 4.45 MIL/uL (ref 3.87–5.11)
RDW: 13.6 % (ref 11.5–15.5)
WBC: 4.2 10*3/uL (ref 4.0–10.5)

## 2014-07-20 LAB — URINALYSIS W MICROSCOPIC + REFLEX CULTURE
Bacteria, UA: NONE SEEN
Bilirubin Urine: NEGATIVE
Casts: NONE SEEN
Crystals: NONE SEEN
Glucose, UA: NEGATIVE mg/dL
Ketones, ur: NEGATIVE mg/dL
Nitrite: NEGATIVE
PH: 7 (ref 5.0–8.0)
PROTEIN: NEGATIVE mg/dL
Specific Gravity, Urine: 1.013 (ref 1.005–1.030)
Squamous Epithelial / LPF: NONE SEEN
Urobilinogen, UA: 0.2 mg/dL (ref 0.0–1.0)

## 2014-07-20 LAB — COMPREHENSIVE METABOLIC PANEL
ALBUMIN: 4.3 g/dL (ref 3.5–5.2)
ALT: 8 U/L (ref 0–35)
AST: 15 U/L (ref 0–37)
Alkaline Phosphatase: 63 U/L (ref 39–117)
BUN: 9 mg/dL (ref 6–23)
CO2: 27 mEq/L (ref 19–32)
Calcium: 9.4 mg/dL (ref 8.4–10.5)
Chloride: 104 mEq/L (ref 96–112)
Creat: 0.85 mg/dL (ref 0.50–1.10)
GLUCOSE: 87 mg/dL (ref 70–99)
POTASSIUM: 4 meq/L (ref 3.5–5.3)
Sodium: 140 mEq/L (ref 135–145)
TOTAL PROTEIN: 6.6 g/dL (ref 6.0–8.3)
Total Bilirubin: 0.7 mg/dL (ref 0.2–1.2)

## 2014-07-20 LAB — TSH: TSH: 2.769 u[IU]/mL (ref 0.350–4.500)

## 2014-07-20 LAB — LIPID PANEL
CHOLESTEROL: 161 mg/dL (ref 0–200)
HDL: 52 mg/dL (ref >39)
LDL CALC: 94 mg/dL (ref 0–99)
TRIGLYCERIDES: 73 mg/dL (ref <150)
Total CHOL/HDL Ratio: 3.1 Ratio
VLDL: 15 mg/dL (ref 0–40)

## 2014-07-20 NOTE — Patient Instructions (Signed)

## 2014-07-20 NOTE — Progress Notes (Signed)
Erica Burch December 12, 1970 [id number]   History:    43 y.o.  for annual exam with no complaints today. Patient in 2012 had a total abdominal hysterectomy and bilateral salpingo-oophorectomy do to her strong family history of primary breast cancer and personal history of positive BRCA1 mutation. Patient has seen Dr. Abran Cantor in 254fr consideration of prophylactic mastectomy and still has not decided. After consultation with her oncologist Dr.Khan, she had been placed on a very low dose transdermal estrogen consisting of Vivelle-Dot 0.075 mg transdermally but patient stated that she used only for 5 months and discontinued it. Patient states that her vasomotor symptoms are very far and in between. She does have some vaginal dryness. She is taking calcium with vitamin D twice a day. Patient with normal Pap smear in 2012. Patient with no prior history of abnormal Pap smears. Patient with strong family history also of diabetes whereby her father is non-insulin-dependent diabetic. Patient's last mammogram was in January of this year which was normal. Patient does her monthly self breast examination. Patient denies any family history of any GI malignancy. No past history of abnormal Pap smears prior to her hysterectomy. Patient had a normal bone density in 2014.   Past medical history,surgical history, family history and social history were all reviewed and documented in the EPIC chart.  Gynecologic History Patient's last menstrual period was 04/28/2009. Contraception: status post hysterectomy Last Pap: 2012. Results were: normal Last mammogram: 2014. Results were: Normal 3-D  Obstetric History OB History  Gravida Para Term Preterm AB SAB TAB Ectopic Multiple Living  _0 # Outcome Date GA Lbr Len/2nd Weight Sex Delivery Anes PTL Lv  3 TRM     M SVD     2 TRM     F SVD     1 TRM     F SVD          ROS: A ROS was performed and pertinent positives and negatives are included in  the history.  GENERAL: No fevers or chills. HEENT: No change in vision, no earache, sore throat or sinus congestion. NECK: No pain or stiffness. CARDIOVASCULAR: No chest pain or pressure. No palpitations. PULMONARY: No shortness of breath, cough or wheeze. GASTROINTESTINAL: No abdominal pain, nausea, vomiting or diarrhea, melena or bright red blood per rectum. GENITOURINARY: No urinary frequency, urgency, hesitancy or dysuria. MUSCULOSKELETAL: No joint or muscle pain, no back pain, no recent trauma. DERMATOLOGIC: No rash, no itching, no lesions. ENDOCRINE: No polyuria, polydipsia, no heat or cold intolerance. No recent change in weight. HEMATOLOGICAL: No anemia or easy bruising or bleeding. NEUROLOGIC: No headache, seizures, numbness, tingling or weakness. PSYCHIATRIC: No depression, no loss of interest in normal activity or change in sleep pattern.     Exam: chaperone present  BP 122/84  Ht 5' 4.5" (1.638 m)  Wt 145 lb (65.772 kg)  BMI 24.51 kg/m2  LMP 04/28/2009  Body mass index is 24.51 kg/(m^2).  General appearance : Well developed well nourished female. No acute distress HEENT: Neck supple, trachea midline, no carotid bruits, no thyroidmegaly Lungs: Clear to auscultation, no rhonchi or wheezes, or rib retractions  Heart: Regular rate and rhythm, no murmurs or gallops Breast:Examined in sitting and supine position were symmetrical in appearance, no palpable masses or tenderness,  no skin retraction, no nipple inversion, no nipple discharge, no skin discoloration, no axillary or supraclavicular lymphadenopathy Abdomen: no palpable masses or tenderness, no rebound  or guarding Extremities: no edema or skin discoloration or tenderness  Pelvic:  Bartholin, Urethra, Skene Glands: Within normal limits             Vagina: No gross lesions or discharge  Cervix: Absent  Uterus absent  Adnexa  Without masses or tenderness  Anus and perineum  normal   Rectovaginal  normal sphincter tone without  palpated masses or tenderness             Hemoccult not indicated     Assessment/Plan:  43 y.o. female fstatus post total abdominal hysterectomy with bilateral salpingo-oophorectomy in 2012 as a result of strong first-line relatives with breast cancer and patient herself with positive BRCA1 gene mutation. Patient is still pondering idea of proceeding with prophylactic mastectomy. Patient CA 125 was normal in 2013.We had discussed before her surgery that based on her being a BRCA1 carrier she had a 60% risk of developing ovarian cancer. We had also discussed my conversation with her oncologist who had stated preoperatively that her risk of breast cancer would be reduced by 30-40% by removing her ovaries. Last year she had spoken with the general surgeon Dr.Hoxsworth who had quoted her that with her history prophylactic mastectomy with reduce her risk of breast cancer by 90%. She is fully aware that having the BRCA1 gene mutation she has a 75-80% risk of breast cancer. She is taking this under consideration and we will check on her insurance coverage and her out-of-pocket expense. She will continue to do her monthly breast examinations. Patient with normal 3 the mammogram this year.We will check a CA 125 today along with her CBC, TSH, screening cholesterol and hemoglobin A1c.New Pap smear screening guidelines discussed. No Pap smear done today. For her vaginal dryness she will be placed on Hyalo Gyn vaginal hydrating gel to reduce the symptoms of vaginal dryness and she may apply it 3 times a week and it is nonhormonal. She was instructed to continue to take her calcium and vitamin D for osteoporosis prevention as well as weightbearing exercises 3-4 times a week. She will followup with the general surgeon for consideration of prophylactic mastectomy. Patient received flu vaccine today. It was recommended that in January of 2016 instead of a three-dimensional mammogram that she have an MRI of both  breasts.  Terrance Mass MD, 10:11 AM 07/20/2014

## 2014-07-21 LAB — CA 125: CA 125: 8 U/mL (ref <35)

## 2014-07-21 LAB — VITAMIN D 25 HYDROXY (VIT D DEFICIENCY, FRACTURES): Vit D, 25-Hydroxy: 43 ng/mL (ref 30–89)

## 2014-07-22 LAB — URINE CULTURE
COLONY COUNT: NO GROWTH
Organism ID, Bacteria: NO GROWTH

## 2014-07-31 ENCOUNTER — Encounter: Payer: Self-pay | Admitting: Gynecology

## 2014-10-02 ENCOUNTER — Ambulatory Visit (INDEPENDENT_AMBULATORY_CARE_PROVIDER_SITE_OTHER): Payer: BLUE CROSS/BLUE SHIELD | Admitting: Gynecology

## 2014-10-02 ENCOUNTER — Encounter: Payer: Self-pay | Admitting: Gynecology

## 2014-10-02 VITALS — BP 120/82

## 2014-10-02 DIAGNOSIS — R312 Other microscopic hematuria: Secondary | ICD-10-CM

## 2014-10-02 DIAGNOSIS — R3129 Other microscopic hematuria: Secondary | ICD-10-CM

## 2014-10-02 DIAGNOSIS — M545 Low back pain, unspecified: Secondary | ICD-10-CM

## 2014-10-02 DIAGNOSIS — Z87442 Personal history of urinary calculi: Secondary | ICD-10-CM

## 2014-10-02 LAB — URINALYSIS W MICROSCOPIC + REFLEX CULTURE
Bilirubin Urine: NEGATIVE
Casts: NONE SEEN
Glucose, UA: NEGATIVE mg/dL
Ketones, ur: NEGATIVE mg/dL
LEUKOCYTES UA: NEGATIVE
NITRITE: NEGATIVE
Protein, ur: NEGATIVE mg/dL
UROBILINOGEN UA: 0.2 mg/dL (ref 0.0–1.0)
pH: 6 (ref 5.0–8.0)

## 2014-10-02 MED ORDER — CIPROFLOXACIN HCL 250 MG PO TABS: 250.0000 mg | ORAL_TABLET | Freq: Two times a day (BID) | ORAL | Status: DC

## 2014-10-02 NOTE — Progress Notes (Signed)
43-year-old who presented to the office today with complaining a few days ago of low back discomfort which had resolved by this morning. She denied any dysuria or frequency. She does state that when she was in her 20s she had history of kidney stones on both kidneys. She denies any fever, chills, nausea, or vomiting. She denies any vaginal discharge.Patient in 2012 had a total abdominal hysterectomy and bilateral salpingo-oophorectomy do to her strong family history of primary breast cancer and personal history of positive BRCA1 mutation.Patient has seen Dr. Hoxsworth in 201for consideration of prophylactic mastectomy and still has not decided. After consultation with her oncologist Dr.Khan, she had been placed on a very low dose transdermal estrogen consisting of Vivelle-Dot 0.075 mg transdermally but patient stated that she used only for 5 months and discontinued it. Patient states that her vasomotor symptoms are very far and in between.   Exam: Back: No CVA tenderness Abdomen: Soft nontender no rebound or guarding Pelvic exam: Not done 30 leg raises with no reproducible back pain.  Urinalysis calcium oxylate crystals were identified, 21-50 RBC, rare bacteria noted  Assessment/plan: Based on patient's past history and symptomatology makes it highly suspicious for patient having had passed a kidney stone. Culture pending. We will start her on Cipro 250 mg twice a day for 3 days and encourage increasing her fluid intake. If she develops any pain, fever, chills, nausea or vomiting she should contact the office or the emergency room if after hours. 

## 2014-10-02 NOTE — Patient Instructions (Signed)
Ciprofloxacin tablets What is this medicine? CIPROFLOXACIN (sip roe FLOX a sin) is a quinolone antibiotic. It is used to treat certain kinds of bacterial infections. It will not work for colds, flu, or other viral infections. This medicine may be used for other purposes; ask your health care provider or pharmacist if you have questions. COMMON BRAND NAME(S): Cipro What should I tell my health care provider before I take this medicine? They need to know if you have any of these conditions: -bone problems -cerebral disease -joint problems -irregular heartbeat -kidney disease -liver disease -myasthenia gravis -seizure disorder -tendon problems -an unusual or allergic reaction to ciprofloxacin, other antibiotics or medicines, foods, dyes, or preservatives -pregnant or trying to get pregnant -breast-feeding How should I use this medicine? Take this medicine by mouth with a glass of water. Follow the directions on the prescription label. Take your medicine at regular intervals. Do not take your medicine more often than directed. Take all of your medicine as directed even if you think your are better. Do not skip doses or stop your medicine early. You can take this medicine with food or on an empty stomach. It can be taken with a meal that contains dairy or calcium, but do not take it alone with a dairy product, like milk or yogurt or calcium-fortified juice. A special MedGuide will be given to you by the pharmacist with each prescription and refill. Be sure to read this information carefully each time. Talk to your pediatrician regarding the use of this medicine in children. Special care may be needed. Overdosage: If you think you have taken too much of this medicine contact a poison control center or emergency room at once. NOTE: This medicine is only for you. Do not share this medicine with others. What if I miss a dose? If you miss a dose, take it as soon as you can. If it is almost time for  your next dose, take only that dose. Do not take double or extra doses. What may interact with this medicine? Do not take this medicine with any of the following medications: -cisapride -droperidol -terfenadine -tizanidine This medicine may also interact with the following medications: -antacids -birth control pills -caffeine -cyclosporin -didanosine (ddI) buffered tablets or powder -medicines for diabetes -medicines for inflammation like ibuprofen, naproxen -methotrexate -multivitamins -omeprazole -phenytoin -probenecid -sucralfate -theophylline -warfarin This list may not describe all possible interactions. Give your health care provider a list of all the medicines, herbs, non-prescription drugs, or dietary supplements you use. Also tell them if you smoke, drink alcohol, or use illegal drugs. Some items may interact with your medicine. What should I watch for while using this medicine? Tell your doctor or health care professional if your symptoms do not improve. Do not treat diarrhea with over the counter products. Contact your doctor if you have diarrhea that lasts more than 2 days or if it is severe and watery. You may get drowsy or dizzy. Do not drive, use machinery, or do anything that needs mental alertness until you know how this medicine affects you. Do not stand or sit up quickly, especially if you are an older patient. This reduces the risk of dizzy or fainting spells. This medicine can make you more sensitive to the sun. Keep out of the sun. If you cannot avoid being in the sun, wear protective clothing and use sunscreen. Do not use sun lamps or tanning beds/booths. Avoid antacids, aluminum, calcium, iron, magnesium, and zinc products for 6 hours before and 2  hours after taking a dose of this medicine. What side effects may I notice from receiving this medicine? Side effects that you should report to your doctor or health care professional as soon as possible: - allergic  reactions like skin rash, itching or hives, swelling of the face, lips, or tongue - breathing problems - confusion, nightmares or hallucinations - feeling faint or lightheaded, falls - irregular heartbeat - joint, muscle or tendon pain or swelling - pain or trouble passing urine -persistent headache with or without blurred vision - redness, blistering, peeling or loosening of the skin, including inside the mouth - seizure - unusual pain, numbness, tingling, or weakness Side effects that usually do not require medical attention (report to your doctor or health care professional if they continue or are bothersome): - diarrhea - nausea or stomach upset - white patches or sores in the mouth This list may not describe all possible side effects. Call your doctor for medical advice about side effects. You may report side effects to FDA at 1-800-FDA-1088. Where should I keep my medicine? Keep out of the reach of children. Store at room temperature below 30 degrees C (86 degrees F). Keep container tightly closed. Throw away any unused medicine after the expiration date. NOTE: This sheet is a summary. It may not cover all possible information. If you have questions about this medicine, talk to your doctor, pharmacist, or health care provider.  2015, Elsevier/Gold Standard. (2013-04-21 16:10:46) Dietary Guidelines to Help Prevent Kidney Stones Your risk of kidney stones can be decreased by adjusting the foods you eat. The most important thing you can do is drink enough fluid. You should drink enough fluid to keep your urine clear or pale yellow. The following guidelines provide specific information for the type of kidney stone you have had. GUIDELINES ACCORDING TO TYPE OF KIDNEY STONE Calcium Oxalate Kidney Stones  Reduce the amount of salt you eat. Foods that have a lot of salt cause your body to release excess calcium into your urine. The excess calcium can combine with a substance  called oxalate to form kidney stones.  Reduce the amount of animal protein you eat if the amount you eat is excessive. Animal protein causes your body to release excess calcium into your urine. Ask your dietitian how much protein from animal sources you should be eating.  Avoid foods that are high in oxalates. If you take vitamins, they should have less than 500 mg of vitamin C. Your body turns vitamin C into oxalates. You do not need to avoid fruits and vegetables high in vitamin C. Calcium Phosphate Kidney Stones  Reduce the amount of salt you eat to help prevent the release of excess calcium into your urine.  Reduce the amount of animal protein you eat if the amount you eat is excessive. Animal protein causes your body to release excess calcium into your urine. Ask your dietitian how much protein from animal sources you should be eating.  Get enough calcium from food or take a calcium supplement (ask your dietitian for recommendations). Food sources of calcium that do not increase your risk of kidney stones include:  Broccoli.  Dairy products, such as cheese and yogurt.  Pudding. Uric Acid Kidney Stones  Do not have more than 6 oz of animal protein per day. FOOD SOURCES Animal Protein Sources  Meat (all types).  Poultry.  Eggs.  Fish, seafood. Foods High in [employer] seasonings.  Soy sauce.  Teriyaki sauce.  Cured and processed meats.  Salted crackers and snack foods.  Fast food.  Canned soups and most canned foods. Foods High in Oxalates  Grains:  Amaranth.  Barley.  Grits.  Wheat germ.  Bran.  Buckwheat flour.  All bran cereals.  Pretzels.  Whole wheat bread.  Vegetables:  Beans (wax).  Beets and beet greens.  Collard greens.  Eggplant.  Escarole.  Leeks.  Okra.  Parsley.  Rutabagas.  Spinach.  Swiss chard.  Tomato paste.  Fried potatoes.  Sweet potatoes.  Fruits:  Red  currants.  Figs.  Kiwi.  Rhubarb.  Meat and Other Protein Sources:  Beans (dried).  Soy burgers and other soybean products.  Miso.  Nuts (peanuts, almonds, pecans, cashews, hazelnuts).  Nut butters.  Sesame seeds and tahini (paste made of sesame seeds).  Poppy seeds.  Beverages:  Chocolate drink mixes.  Soy milk.  Instant iced tea.  Juices made from high-oxalate fruits or vegetables.  Other:  Carob.  Chocolate.  Fruitcake.  Marmalades. Document Released: 01/10/2011 Document Revised: 09/20/2013 Document Reviewed: 08/12/2013 [facility] Patient Information 2015 [place], [place]. This information is not intended to replace advice given to you by your health care provider. Make sure you discuss any questions you have with your health care provider. Kidney Stones Kidney stones (urolithiasis) are deposits that form inside your kidneys. The intense pain is caused by the stone moving through the urinary tract. When the stone moves, the ureter goes into spasm around the stone. The stone is usually passed in the urine.  CAUSES   A disorder that makes certain neck glands produce too much parathyroid hormone (primary hyperparathyroidism).  A buildup of uric acid crystals, similar to gout in your joints.  Narrowing (stricture) of the ureter.  A kidney obstruction present at birth (congenital obstruction).  Previous surgery on the kidney or ureters.  Numerous kidney infections. SYMPTOMS   Feeling sick to your stomach (nauseous).  Throwing up (vomiting).  Blood in the urine (hematuria).  Pain that usually spreads (radiates) to the groin.  Frequency or urgency of urination. DIAGNOSIS   Taking a history and physical exam.  Blood or urine tests.  CT scan.  Occasionally, an examination of the inside of the urinary bladder (cystoscopy) is performed. TREATMENT   Observation.  Increasing your fluid intake.  Extracorporeal shock wave lithotripsy--This is a  noninvasive procedure that uses shock waves to break up kidney stones.  Surgery may be needed if you have severe pain or persistent obstruction. There are various surgical procedures. Most of the procedures are performed with the use of small instruments. Only small incisions are needed to accommodate these instruments, so recovery time is minimized. The size, location, and chemical composition are all important variables that will determine the proper choice of action for you. Talk to your health care provider to better understand your situation so that you will minimize the risk of injury to yourself and your kidney.  HOME CARE INSTRUCTIONS   Drink enough water and fluids to keep your urine clear or pale yellow. This will help you to pass the stone or stone fragments.  Strain all urine through the provided strainer. Keep all particulate matter and stones for your health care provider to see. The stone causing the pain may be as small as a grain of salt. It is very important to use the strainer each and every time you pass your urine. The collection of your stone will allow your health care provider to analyze it and verify that a stone has actually passed.  The stone analysis will often identify what you can do to reduce the incidence of recurrences.  Only take over-the-counter or prescription medicines for pain, discomfort, or fever as directed by your health care provider.  Make a follow-up appointment with your health care provider as directed.  Get follow-up X-rays if required. The absence of pain does not always mean that the stone has passed. It may have only stopped moving. If the urine remains completely obstructed, it can cause loss of kidney function or even complete destruction of the kidney. It is your responsibility to make sure X-rays and follow-ups are completed. Ultrasounds of the kidney can show blockages and the status of the kidney. Ultrasounds are not associated with any radiation  and can be performed easily in a matter of minutes. SEEK MEDICAL CARE IF:  You experience pain that is progressive and unresponsive to any pain medicine you have been prescribed. SEEK IMMEDIATE MEDICAL CARE IF:   Pain cannot be controlled with the prescribed medicine.  You have a fever or shaking chills.  The severity or intensity of pain increases over 18 hours and is not relieved by pain medicine.  You develop a new onset of abdominal pain.  You feel faint or pass out.  You are unable to urinate. MAKE SURE YOU:   Understand these instructions.  Will watch your condition.  Will get help right away if you are not doing well or get worse. Document Released: 09/15/2005 Document Revised: 05/18/2013 Document Reviewed: 02/16/2013 [facility] Patient Information 2015 [place], [place]. This information is not intended to replace advice given to you by your health care provider. Make sure you discuss any questions you have with your health care provider.

## 2014-10-04 ENCOUNTER — Other Ambulatory Visit: Payer: Self-pay | Admitting: Gynecology

## 2014-10-04 DIAGNOSIS — R3129 Other microscopic hematuria: Secondary | ICD-10-CM

## 2014-10-04 LAB — URINE CULTURE
Colony Count: NO GROWTH
ORGANISM ID, BACTERIA: NO GROWTH

## 2014-10-10 ENCOUNTER — Other Ambulatory Visit: Payer: BLUE CROSS/BLUE SHIELD

## 2014-10-10 DIAGNOSIS — R3129 Other microscopic hematuria: Secondary | ICD-10-CM

## 2014-10-11 LAB — URINALYSIS W MICROSCOPIC + REFLEX CULTURE
BACTERIA UA: NONE SEEN
Bilirubin Urine: NEGATIVE
CRYSTALS: NONE SEEN
Casts: NONE SEEN
Glucose, UA: NEGATIVE mg/dL
Hgb urine dipstick: NEGATIVE
KETONES UR: NEGATIVE mg/dL
Leukocytes, UA: NEGATIVE
NITRITE: NEGATIVE
Protein, ur: NEGATIVE mg/dL
SPECIFIC GRAVITY, URINE: 1.005 (ref 1.005–1.030)
SQUAMOUS EPITHELIAL / LPF: NONE SEEN
Urobilinogen, UA: 0.2 mg/dL (ref 0.0–1.0)
pH: 7 (ref 5.0–8.0)

## 2014-11-06 ENCOUNTER — Telehealth: Payer: Self-pay | Admitting: *Deleted

## 2014-11-06 DIAGNOSIS — Z1501 Genetic susceptibility to malignant neoplasm of breast: Secondary | ICD-10-CM

## 2014-11-06 DIAGNOSIS — Z853 Personal history of malignant neoplasm of breast: Secondary | ICD-10-CM

## 2014-11-06 DIAGNOSIS — Z1509 Genetic susceptibility to other malignant neoplasm: Secondary | ICD-10-CM

## 2014-11-06 NOTE — Telephone Encounter (Signed)
Pt called stating you would like for her to have a MRI of breast rather than a mammogram screening this year due family history and postive BRCA testing. Per note on 07/20/14 order will be placed for this pt will call to schedule.

## 2014-11-14 ENCOUNTER — Encounter: Payer: Self-pay | Admitting: Gynecology

## 2014-11-16 ENCOUNTER — Other Ambulatory Visit: Payer: Self-pay

## 2014-11-16 DIAGNOSIS — Z1231 Encounter for screening mammogram for malignant neoplasm of breast: Secondary | ICD-10-CM

## 2014-11-20 NOTE — Telephone Encounter (Signed)
Appointment 12/09/14 @ 10:00am

## 2014-11-27 ENCOUNTER — Ambulatory Visit
Admission: RE | Admit: 2014-11-27 | Discharge: 2014-11-27 | Disposition: A | Discharge status: Home / Self Care | Payer: BLUE CROSS/BLUE SHIELD | Source: Ambulatory Visit

## 2014-11-27 DIAGNOSIS — Z1231 Encounter for screening mammogram for malignant neoplasm of breast: Secondary | ICD-10-CM

## 2014-12-09 ENCOUNTER — Other Ambulatory Visit: Payer: Self-pay

## 2014-12-16 ENCOUNTER — Other Ambulatory Visit: Payer: BLUE CROSS/BLUE SHIELD

## 2015-03-09 ENCOUNTER — Encounter: Payer: Self-pay | Admitting: Women's Health

## 2015-03-09 ENCOUNTER — Ambulatory Visit (INDEPENDENT_AMBULATORY_CARE_PROVIDER_SITE_OTHER): Payer: BLUE CROSS/BLUE SHIELD | Admitting: Women's Health

## 2015-03-09 DIAGNOSIS — R319 Hematuria, unspecified: Secondary | ICD-10-CM

## 2015-03-09 LAB — URINALYSIS W MICROSCOPIC + REFLEX CULTURE
Bilirubin Urine: NEGATIVE
CRYSTALS: NONE SEEN
Casts: NONE SEEN
Glucose, UA: NEGATIVE mg/dL
Ketones, ur: NEGATIVE mg/dL
Nitrite: NEGATIVE
PH: 6.5 (ref 5.0–8.0)
Protein, ur: 30 mg/dL — AB
SPECIFIC GRAVITY, URINE: 1.015 (ref 1.005–1.030)
Urobilinogen, UA: 0.2 mg/dL (ref 0.0–1.0)

## 2015-03-09 MED ORDER — IBUPROFEN 600 MG PO TABS: 600.0000 mg | ORAL_TABLET | Freq: Three times a day (TID) | ORAL | Status: DC | PRN

## 2015-03-09 NOTE — Patient Instructions (Signed)
Dietary Guidelines to Help Prevent Kidney Stones  Your risk of kidney stones can be decreased by adjusting the foods you eat. The most important thing you can do is drink enough fluid. You should drink enough fluid to keep your urine clear or pale yellow. The following guidelines provide specific information for the type of kidney stone you have had.  GUIDELINES ACCORDING TO TYPE OF KIDNEY STONE  Calcium Oxalate Kidney Stones  · Reduce the amount of salt you eat. Foods that have a lot of salt cause your body to release excess calcium into your urine. The excess calcium can combine with a substance called oxalate to form kidney stones.  · Reduce the amount of animal protein you eat if the amount you eat is excessive. Animal protein causes your body to release excess calcium into your urine. Ask your dietitian how much protein from animal sources you should be eating.  · Avoid foods that are high in oxalates. If you take vitamins, they should have less than 500 mg of vitamin C. Your body turns vitamin C into oxalates. You do not need to avoid fruits and vegetables high in vitamin C.  Calcium Phosphate Kidney Stones  · Reduce the amount of salt you eat to help prevent the release of excess calcium into your urine.  · Reduce the amount of animal protein you eat if the amount you eat is excessive. Animal protein causes your body to release excess calcium into your urine. Ask your dietitian how much protein from animal sources you should be eating.  · Get enough calcium from food or take a calcium supplement (ask your dietitian for recommendations). Food sources of calcium that do not increase your risk of kidney stones include:  ¨ Broccoli.  ¨ Dairy products, such as cheese and yogurt.  ¨ Pudding.  Uric Acid Kidney Stones  · Do not have more than 6 oz of animal protein per day.  FOOD SOURCES  Animal Protein Sources  · Meat (all types).  · Poultry.  · Eggs.  · Fish, seafood.  Foods High in Salt  · Salt seasonings.  · Soy  sauce.  · Teriyaki sauce.  · Cured and processed meats.  · Salted crackers and snack foods.  · Fast food.  · Canned soups and most canned foods.  Foods High in Oxalates  · Grains:  ¨ Amaranth.  ¨ Barley.  ¨ Grits.  ¨ Wheat germ.  ¨ Bran.  ¨ Buckwheat flour.  ¨ All bran cereals.  ¨ Pretzels.  ¨ Whole wheat bread.  · Vegetables:  ¨ Beans (wax).  ¨ Beets and beet greens.  ¨ Collard greens.  ¨ Eggplant.  ¨ Escarole.  ¨ Leeks.  ¨ Okra.  ¨ Parsley.  ¨ Rutabagas.  ¨ Spinach.  ¨ Swiss chard.  ¨ Tomato paste.  ¨ Fried potatoes.  ¨ Sweet potatoes.  · Fruits:  ¨ Red currants.  ¨ Figs.  ¨ Kiwi.  ¨ Rhubarb.  · Meat and Other Protein Sources:  ¨ Beans (dried).  ¨ Soy burgers and other soybean products.  ¨ Miso.  ¨ Nuts (peanuts, almonds, pecans, cashews, hazelnuts).  ¨ Nut butters.  ¨ Sesame seeds and tahini (paste made of sesame seeds).  ¨ Poppy seeds.  · Beverages:  ¨ Chocolate drink mixes.  ¨ Soy milk.  ¨ Instant iced tea.  ¨ Juices made from high-oxalate fruits or vegetables.  · Other:  ¨ Carob.  ¨ Chocolate.  ¨ Fruitcake.  ¨ Marmalades.  Document Released:   01/10/2011 Document Revised: 09/20/2013 Document Reviewed: 08/12/2013  Exit[employer]® Patient Information ©2015 [employer]. This information is not intended to replace advice given to you by your health care provider. Make sure you discuss any questions you have with your health care provider.

## 2015-03-09 NOTE — Progress Notes (Signed)
Patient ID: Erica Burch, female   DOB: [birth year], 44 y.o.   MRN: [id number] Presents with complaint of painless blood in urine today. Noted yesterday urine appeared darker. Denies any pain, burning, frequency, change in pattern. History of kidney stones. Denies vaginal discharge, abdominal pain or fever. 2012 TAH with BSO for fibroids.  Exam: Appears well. No CVAT. Abdomen soft no rebound or radiation of pain. UA: visible blood noted,  Large blood, trace leukocytes, 0-2 WBCs, TNTC RBCs, rare bacteria.  Painless Hematuria  Plan: Urine culture pending. Instructed to increase fluids, Motrin 600 if needed for pain, recheck urine in 2 weeks to be sure hematuria resolves. Instructed to schedule appointment for UA recheck.

## 2015-03-12 LAB — URINE CULTURE

## 2015-03-23 ENCOUNTER — Other Ambulatory Visit: Payer: BLUE CROSS/BLUE SHIELD

## 2015-03-23 DIAGNOSIS — R319 Hematuria, unspecified: Secondary | ICD-10-CM

## 2015-03-24 LAB — URINALYSIS W MICROSCOPIC + REFLEX CULTURE
BACTERIA UA: NONE SEEN
Bilirubin Urine: NEGATIVE
Casts: NONE SEEN
Crystals: NONE SEEN
Glucose, UA: NEGATIVE mg/dL
Ketones, ur: NEGATIVE mg/dL
LEUKOCYTES UA: NEGATIVE
NITRITE: NEGATIVE
PH: 6 (ref 5.0–8.0)
PROTEIN: NEGATIVE mg/dL
Specific Gravity, Urine: 1.015 (ref 1.005–1.030)
Squamous Epithelial / LPF: NONE SEEN
UROBILINOGEN UA: 0.2 mg/dL (ref 0.0–1.0)

## 2015-03-25 LAB — URINE CULTURE: Colony Count: >=100000

## 2015-03-26 ENCOUNTER — Other Ambulatory Visit: Payer: Self-pay | Admitting: Women's Health

## 2015-03-26 DIAGNOSIS — N3001 Acute cystitis with hematuria: Secondary | ICD-10-CM

## 2015-03-26 MED ORDER — AMPICILLIN 500 MG PO CAPS: 500.0000 mg | ORAL_CAPSULE | Freq: Three times a day (TID) | ORAL | Status: DC

## 2015-04-11 ENCOUNTER — Other Ambulatory Visit: Payer: BLUE CROSS/BLUE SHIELD

## 2015-04-11 DIAGNOSIS — N3001 Acute cystitis with hematuria: Secondary | ICD-10-CM

## 2015-04-12 LAB — URINALYSIS W MICROSCOPIC + REFLEX CULTURE
Bacteria, UA: NONE SEEN
Bilirubin Urine: NEGATIVE
Casts: NONE SEEN
Crystals: NONE SEEN
GLUCOSE, UA: NEGATIVE mg/dL
Ketones, ur: NEGATIVE mg/dL
Nitrite: NEGATIVE
PH: 6.5 (ref 5.0–8.0)
PROTEIN: NEGATIVE mg/dL
SQUAMOUS EPITHELIAL / LPF: NONE SEEN
Specific Gravity, Urine: 1.015 (ref 1.005–1.030)
Urobilinogen, UA: 0.2 mg/dL (ref 0.0–1.0)

## 2015-04-13 LAB — URINE CULTURE: Colony Count: >=100000

## 2015-05-23 ENCOUNTER — Other Ambulatory Visit: Payer: Self-pay | Admitting: Urology

## 2015-05-23 ENCOUNTER — Encounter (HOSPITAL_BASED_OUTPATIENT_CLINIC_OR_DEPARTMENT_OTHER): Payer: Self-pay | Admitting: *Deleted

## 2015-05-23 NOTE — Progress Notes (Signed)
NPO AFTER MN.  ARRIVE AT 0600.  NEEDS HG. MAY TAKE PAIN RX IF NEEDED W/ SIPS OF WATER AM DOS.

## 2015-05-23 NOTE — H&P (Signed)
Active Problems Problems  1. Calculus of kidney (N20.0) 2. Calculus of left ureter (N20.1) 3. Gross hematuria (R31.0) 4. Microscopic hematuria (R31.2) 5. 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  She was found to have 2 possible small left distal ureteral stones on CT.  She has continued to have gross hematuria with some yesterday and still has pain.   Surgical History Problems  1. History of Cholecystectomy 2. History of Hysterectomy  Current Meds 1. Oxycodone-Acetaminophen 5-325 MG Oral Tablet;  Therapy: (Recorded:24Aug2016) to Recorded  Allergies Medication  1. No Known Drug Allergies  Family History Problems  1. Family history of kidney stones (Z84.1) : Mother, Sister  Social History Problems  1. Former smoker [phone]) 2. Married 3. No alcohol use 4. Occasional caffeine consumption 5. Three children  Review of Systems Genitourinary, constitutional, skin, eye, otolaryngeal, hematologic/lymphatic, cardiovascular, pulmonary, endocrine, musculoskeletal, gastrointestinal, neurological and psychiatric system(s) were reviewed and pertinent findings if present are noted and are otherwise negative.  Genitourinary: hematuria.  Gastrointestinal: flank pain.    Vitals Vital Signs [Data Includes: Last 1 Day]  Recorded: 24Aug2016 12:05PM  Blood Pressure: 157 / 93 Temperature: 97.2 F Heart Rate: 77  Physical Exam Constitutional: Well nourished and well developed . No acute distress.  ENT:. The ears and nose are normal in appearance.  Pulmonary: No respiratory distress and normal respiratory rhythm and effort.  Cardiovascular: Heart rate and rhythm are normal . No peripheral edema.    Results/Data Urine [Data Includes: Last 1 Day]   24Aug2016  COLOR YELLOW   APPEARANCE CLEAR   SPECIFIC GRAVITY 1.010   pH 6.0   GLUCOSE NEGATIVE   BILIRUBIN NEGATIVE   KETONE NEGATIVE   BLOOD 3+   PROTEIN NEGATIVE   NITRITE  NEGATIVE   LEUKOCYTE ESTERASE NEGATIVE   SQUAMOUS EPITHELIAL/HPF NONE SEEN HPF  WBC NONE SEEN WBC/HPF  RBC 3-10 RBC/HPF  BACTERIA NONE SEEN HPF  CRYSTALS NONE SEEN HPF  CASTS NONE SEEN LPF  Yeast NONE SEEN HPF  Selected Results  URINE CULTURE 08Aug2016 02:51PM Jiles Crocker  SOURCE : CLEAN CATCH SPECIMEN TYPE: URINE   Test Name Result Flag Reference  CULTURE, URINE Culture, Urine    ===== COLONY COUNT: =====  NO GROWTH   FINAL REPORT: NO GROWTH   URINE CULTURE 21Jul2016 02:20PM Irine Seal  SOURCE : CLEAN CATCH SPECIMEN TYPE: CLEAN CATCH   Test Name Result Flag Reference  CULTURE, URINE Culture, Urine    ===== COLONY COUNT: =====  10,000 COLONIES/ML   FINAL REPORT: GROUP B STREP (S.AGALACTIAE) ISOLATED  TESTING AGAINST S. AGALACTIAE NOT ROUTINELY PERFORMED DUE TO PREDICTABILITY OF AMP/PEN/VAN SUSCEPTIBILITY.   BASIC METABOLIC PANEL [id number] 03:83FX Irine Seal  SPECIMEN TYPE: BLOOD   Test Name Result Flag Reference  GLUCOSE 80 mg/dL  70-99  BUN 13 mg/dL  6-23  CREATININE 0.75 mg/dL  0.50-1.40  SODIUM 144 mEq/L  135-145  POTASSIUM 4.1 mEq/L  3.5-5.3  CHLORIDE 107 mEq/L  96-112  CO2 27 mEq/L  19-32  CALCIUM 9.2 mg/dL  8.4-10.5  Est GFR, African American >89 mL/min    Est GFR, NonAfrican American >89 mL/min    THE ESTIMATED GFR IS A CALCULATION VALID FOR ADULTS (>=22 YEARS OLD) THAT USES THE CKD-EPI ALGORITHM TO ADJUST FOR AGE AND SEX. IT IS   NOT TO BE USED FOR CHILDREN, PREGNANT WOMEN, HOSPITALIZED PATIENTS,    PATIENTS ON DIALYSIS, OR WITH RAPIDLY CHANGING KIDNEY FUNCTION. ACCORDING TO THE NKDEP, EGFR >89 IS  NORMAL, 60-89 SHOWS MILD IMPAIRMENT, 30-59 SHOWS MODERATE IMPAIRMENT, 15-29 SHOWS SEVERE IMPAIRMENT AND <15 IS ESRD.   AU CT-STONE PROTOCOL [id number] 12:00AM Irine Seal   Test Name Result Flag Reference  CT-STONE PROTOCOL (Report)    ** RADIOLOGY REPORT BY [facility] **   CLINICAL DATA: Gross and micro hematuria with low back and  pelvic pain for 6 weeks. History of renal calculi. Initial encounter.  EXAM: CT ABDOMEN AND PELVIS WITHOUT CONTRAST  TECHNIQUE: Multidetector CT imaging of the abdomen and pelvis was performed following the standard protocol without IV contrast.  COMPARISON: None.  FINDINGS: Lower chest: Clear lung bases. No significant pleural or pericardial effusion.  Hepatobiliary: There is a 5 mm cystic lesion anteriorly in the hepatic dome on image 8. No suspicious liver lesions. No significant biliary dilatation status post cholecystectomy.  Pancreas: Unremarkable. No pancreatic ductal dilatation or surrounding inflammatory changes.  Spleen: Normal in size without focal abnormality.  Adrenals/Urinary Tract: Both adrenal glands appear normal.There are bilateral renal calculi measuring up to 4 mm in the interpolar region of the right kidney on image 28. There is no perinephric soft tissue stranding. However, there is mild asymmetric dilatation of the left ureter. Left pelvic calcifications are in close proximity to the ureterovesical junction. Based on the reformatted images, there may be 2 small calculi in the distal left ureter, measuring up to 4 mm in diameter. No evidence of right ureteral calculus or bladder calculus. The bladder appears unremarkable.  Stomach/Bowel: No evidence of bowel wall thickening, distention or surrounding inflammatory change.There is moderate ingested material within the stomach. The appendix appears normal.  Vascular/Lymphatic: There are no enlarged abdominal or pelvic lymph nodes. There is a 12 mm local splenic artery aneurysm on image 13. No other significant vascular findings on noncontrast imaging.  Reproductive: Hysterectomy. No evidence of adnexal mass.  Other: No evidence of abdominal wall mass or hernia.  Musculoskeletal: No acute or significant osseous findings.  IMPRESSION: 1. Small bilateral renal calculi. 2. Possible distal small left  ureteral calculi with associated minimal asymmetric dilatation of the left ureter. There are no other secondary signs of ureteral obstruction. 3. Small splenic artery aneurysm.   Electronically Signed  By: Richardean Sale M.D.  On: 04/19/2015 14:10   Assessment Assessed  1. Gross hematuria (R31.0) 2. Calculus of left ureter (N20.1)  She has gross hematuria with possible left distal stones.   Plan Health Maintenance  1. UA With REFLEX; [Do Not Release]; Status:Resulted - Requires Verification;   Done:  [id number] 11:45AM  I discussed the options and will get her set up for cystoscopy with bilateral retrogrades and possible left ureteroscopy and stenting.  I have reviewed the risks of bleeding, infection, injury to the bladder or ureter, need for a stent or secondary procedures, thrombotic events and anesthetic risks.   Signatures Electronically signed by : Irine Seal, M.D.; May 23 2015 12:31PM EST

## 2015-05-24 ENCOUNTER — Encounter (HOSPITAL_BASED_OUTPATIENT_CLINIC_OR_DEPARTMENT_OTHER): Payer: Self-pay | Admitting: *Deleted

## 2015-05-24 ENCOUNTER — Ambulatory Visit (HOSPITAL_BASED_OUTPATIENT_CLINIC_OR_DEPARTMENT_OTHER): Payer: BLUE CROSS/BLUE SHIELD | Admitting: Anesthesiology

## 2015-05-24 ENCOUNTER — Encounter (HOSPITAL_BASED_OUTPATIENT_CLINIC_OR_DEPARTMENT_OTHER)
Admission: RE | Disposition: A | Discharge status: Home / Self Care | Payer: Self-pay | Source: Ambulatory Visit | Attending: Urology

## 2015-05-24 ENCOUNTER — Ambulatory Visit (HOSPITAL_BASED_OUTPATIENT_CLINIC_OR_DEPARTMENT_OTHER)
Admission: RE | Admit: 2015-05-24 | Discharge: 2015-05-24 | Disposition: A | Discharge status: Home / Self Care | Payer: BLUE CROSS/BLUE SHIELD | Source: Ambulatory Visit | Attending: Urology | Admitting: Urology

## 2015-05-24 DIAGNOSIS — N201 Calculus of ureter: Secondary | ICD-10-CM | POA: Diagnosis not present

## 2015-05-24 DIAGNOSIS — R310 Gross hematuria: Secondary | ICD-10-CM | POA: Diagnosis present

## 2015-05-24 DIAGNOSIS — Z87891 Personal history of nicotine dependence: Secondary | ICD-10-CM | POA: Diagnosis not present

## 2015-05-24 DIAGNOSIS — I739 Peripheral vascular disease, unspecified: Secondary | ICD-10-CM | POA: Insufficient documentation

## 2015-05-24 DIAGNOSIS — Z79891 Long term (current) use of opiate analgesic: Secondary | ICD-10-CM | POA: Insufficient documentation

## 2015-05-24 DIAGNOSIS — I728 Aneurysm of other specified arteries: Secondary | ICD-10-CM | POA: Diagnosis not present

## 2015-05-24 HISTORY — DX: Aneurysm of other specified arteries: I72.8

## 2015-05-24 HISTORY — DX: Personal history of other benign neoplasm: Z86.018

## 2015-05-24 HISTORY — DX: Gross hematuria: R31.0

## 2015-05-24 HISTORY — DX: Presence of spectacles and contact lenses: Z97.3

## 2015-05-24 HISTORY — PX: HOLMIUM LASER APPLICATION: SHX5852

## 2015-05-24 HISTORY — DX: Personal history of urinary calculi: Z87.442

## 2015-05-24 HISTORY — PX: CYSTOSCOPY WITH RETROGRADE PYELOGRAM, URETEROSCOPY AND STENT PLACEMENT: SHX5789

## 2015-05-24 HISTORY — DX: Calculus of kidney: N20.0

## 2015-05-24 HISTORY — DX: Genetic susceptibility to malignant neoplasm of breast: Z15.01

## 2015-05-24 HISTORY — DX: Genetic susceptibility to other malignant neoplasm: Z15.09

## 2015-05-24 LAB — POCT HEMOGLOBIN-HEMACUE: HEMOGLOBIN: 12.8 g/dL (ref 12.0–15.0)

## 2015-05-24 SURGERY — CYSTOURETEROSCOPY, WITH RETROGRADE PYELOGRAM AND STENT INSERTION
Anesthesia: General | Laterality: Bilateral

## 2015-05-24 MED ORDER — ACETAMINOPHEN 650 MG RE SUPP
650.0000 mg | RECTAL | Status: DC | PRN
  Filled 2015-05-24: qty 1

## 2015-05-24 MED ORDER — FENTANYL CITRATE (PF) 100 MCG/2ML IJ SOLN
INTRAMUSCULAR | Status: DC | PRN
  Administered 2015-05-24: 100 ug via INTRAVENOUS

## 2015-05-24 MED ORDER — PHENAZOPYRIDINE HCL 200 MG PO TABS: 200.0000 mg | ORAL_TABLET | Freq: Three times a day (TID) | ORAL | Status: DC | PRN

## 2015-05-24 MED ORDER — SODIUM CHLORIDE 0.9 % IJ SOLN
3.0000 mL | INTRAMUSCULAR | Status: DC | PRN
  Filled 2015-05-24: qty 3

## 2015-05-24 MED ORDER — ACETAMINOPHEN 325 MG PO TABS
650.0000 mg | ORAL_TABLET | ORAL | Status: DC | PRN
  Filled 2015-05-24: qty 2

## 2015-05-24 MED ORDER — KETOROLAC TROMETHAMINE 30 MG/ML IJ SOLN
INTRAMUSCULAR | Status: DC | PRN
  Administered 2015-05-24: 30 mg via INTRAVENOUS

## 2015-05-24 MED ORDER — ONDANSETRON HCL 4 MG/2ML IJ SOLN
INTRAMUSCULAR | Status: DC | PRN
  Administered 2015-05-24: 4 mg via INTRAVENOUS

## 2015-05-24 MED ORDER — CIPROFLOXACIN IN D5W 400 MG/200ML IV SOLN
400.0000 mg | INTRAVENOUS | Status: AC
  Administered 2015-05-24: 400 mg via INTRAVENOUS
  Filled 2015-05-24: qty 200

## 2015-05-24 MED ORDER — FENTANYL CITRATE (PF) 100 MCG/2ML IJ SOLN
INTRAMUSCULAR | Status: AC
  Filled 2015-05-24: qty 4

## 2015-05-24 MED ORDER — SODIUM CHLORIDE 0.9 % IR SOLN
Status: DC | PRN
  Administered 2015-05-24: 4000 mL via INTRAVESICAL

## 2015-05-24 MED ORDER — SODIUM CHLORIDE 0.9 % IV SOLN
250.0000 mL | INTRAVENOUS | Status: DC | PRN
  Filled 2015-05-24: qty 250

## 2015-05-24 MED ORDER — DEXAMETHASONE SODIUM PHOSPHATE 4 MG/ML IJ SOLN
INTRAMUSCULAR | Status: DC | PRN
  Administered 2015-05-24: 10 mg via INTRAVENOUS

## 2015-05-24 MED ORDER — FENTANYL CITRATE (PF) 100 MCG/2ML IJ SOLN
25.0000 ug | INTRAMUSCULAR | Status: DC | PRN
  Filled 2015-05-24: qty 1

## 2015-05-24 MED ORDER — PHENAZOPYRIDINE HCL 200 MG PO TABS
200.0000 mg | ORAL_TABLET | Freq: Three times a day (TID) | ORAL | Status: DC
  Administered 2015-05-24: 200 mg via ORAL
  Filled 2015-05-24: qty 1

## 2015-05-24 MED ORDER — MIDAZOLAM HCL 2 MG/2ML IJ SOLN
INTRAMUSCULAR | Status: AC
  Filled 2015-05-24: qty 2

## 2015-05-24 MED ORDER — FENTANYL CITRATE (PF) 100 MCG/2ML IJ SOLN
INTRAMUSCULAR | Status: AC
  Filled 2015-05-24: qty 2

## 2015-05-24 MED ORDER — SODIUM CHLORIDE 0.9 % IJ SOLN
3.0000 mL | Freq: Two times a day (BID) | INTRAMUSCULAR | Status: DC
  Filled 2015-05-24: qty 3

## 2015-05-24 MED ORDER — MIDAZOLAM HCL 5 MG/5ML IJ SOLN
INTRAMUSCULAR | Status: DC | PRN
  Administered 2015-05-24: 2 mg via INTRAVENOUS

## 2015-05-24 MED ORDER — LIDOCAINE HCL (CARDIAC) 20 MG/ML IV SOLN
INTRAVENOUS | Status: DC | PRN
  Administered 2015-05-24: 50 mg via INTRAVENOUS

## 2015-05-24 MED ORDER — LACTATED RINGERS IV SOLN
INTRAVENOUS | Status: DC
  Administered 2015-05-24: 07:00:00 via INTRAVENOUS
  Filled 2015-05-24: qty 1000

## 2015-05-24 MED ORDER — BELLADONNA ALKALOIDS-OPIUM 16.2-60 MG RE SUPP
RECTAL | Status: DC | PRN
  Administered 2015-05-24: 1 via RECTAL

## 2015-05-24 MED ORDER — PHENAZOPYRIDINE HCL 100 MG PO TABS
ORAL_TABLET | ORAL | Status: AC
  Filled 2015-05-24: qty 2

## 2015-05-24 MED ORDER — PROPOFOL 10 MG/ML IV BOLUS
INTRAVENOUS | Status: DC | PRN
  Administered 2015-05-24: 150 mg via INTRAVENOUS

## 2015-05-24 MED ORDER — LACTATED RINGERS IV SOLN
INTRAVENOUS | Status: DC
  Filled 2015-05-24: qty 1000

## 2015-05-24 MED ORDER — CIPROFLOXACIN IN D5W 400 MG/200ML IV SOLN
INTRAVENOUS | Status: AC
  Filled 2015-05-24: qty 200

## 2015-05-24 MED ORDER — BELLADONNA ALKALOIDS-OPIUM 16.2-60 MG RE SUPP
RECTAL | Status: AC
  Filled 2015-05-24: qty 1

## 2015-05-24 MED ORDER — FENTANYL CITRATE (PF) 100 MCG/2ML IJ SOLN
25.0000 ug | INTRAMUSCULAR | Status: DC | PRN
Start: 2015-05-24 — End: 2015-05-24
  Filled 2015-05-24: qty 1

## 2015-05-24 MED ORDER — OXYCODONE HCL 5 MG PO TABS
5.0000 mg | ORAL_TABLET | ORAL | Status: DC | PRN
  Filled 2015-05-24: qty 2

## 2015-05-24 SURGICAL SUPPLY — 39 items
BAG DRAIN URO-CYSTO SKYTR STRL (DRAIN) ×3 IMPLANT
BAG DRN UROCATH (DRAIN) ×2
BASKET LASER NITINOL 1.9FR (BASKET) IMPLANT
BASKET STONE 1.7 NGAGE (UROLOGICAL SUPPLIES) ×1 IMPLANT
BASKET ZERO TIP NITINOL 2.4FR (BASKET) IMPLANT
BSKT STON RTRVL 120 1.9FR (BASKET)
BSKT STON RTRVL ZERO TP 2.4FR (BASKET)
CANISTER SUCT LVC 12 LTR MEDI- (MISCELLANEOUS) IMPLANT
CATH URET 5FR 28IN CONE TIP (BALLOONS)
CATH URET 5FR 28IN OPEN ENDED (CATHETERS) ×1 IMPLANT
CATH URET 5FR 70CM CONE TIP (BALLOONS) IMPLANT
CLOTH BEACON ORANGE TIMEOUT ST (SAFETY) ×3 IMPLANT
ELECT REM PT RETURN 9FT ADLT (ELECTROSURGICAL)
ELECTRODE REM PT RTRN 9FT ADLT (ELECTROSURGICAL) IMPLANT
FIBER LASER FLEXIVA 1000 (UROLOGICAL SUPPLIES) IMPLANT
FIBER LASER FLEXIVA 365 (UROLOGICAL SUPPLIES) ×1 IMPLANT
FIBER LASER FLEXIVA 550 (UROLOGICAL SUPPLIES) IMPLANT
FIBER LASER TRAC TIP (UROLOGICAL SUPPLIES) IMPLANT
GLOVE BIOGEL PI IND STRL 7.5 (GLOVE) IMPLANT
GLOVE BIOGEL PI INDICATOR 7.5 (GLOVE) ×2
GLOVE SURG SS PI 8.0 STRL IVOR (GLOVE) ×3 IMPLANT
GOWN STRL REUS W/ TWL LRG LVL3 (GOWN DISPOSABLE) IMPLANT
GOWN STRL REUS W/ TWL XL LVL3 (GOWN DISPOSABLE) ×2 IMPLANT
GOWN STRL REUS W/TWL LRG LVL3 (GOWN DISPOSABLE)
GOWN STRL REUS W/TWL XL LVL3 (GOWN DISPOSABLE) ×3
GUIDEWIRE 0.038 PTFE COATED (WIRE) IMPLANT
GUIDEWIRE ANG ZIPWIRE 038X150 (WIRE) IMPLANT
GUIDEWIRE STR DUAL SENSOR (WIRE) ×3 IMPLANT
IV NS 1000ML (IV SOLUTION) ×3
IV NS 1000ML BAXH (IV SOLUTION) IMPLANT
IV NS IRRIG 3000ML ARTHROMATIC (IV SOLUTION) ×3 IMPLANT
KIT BALLIN UROMAX 15FX10 (LABEL) IMPLANT
KIT BALLN UROMAX 15FX4 (MISCELLANEOUS) IMPLANT
KIT BALLN UROMAX 26 75X4 (MISCELLANEOUS)
MANIFOLD NEPTUNE II (INSTRUMENTS) ×1 IMPLANT
PACK CYSTO (CUSTOM PROCEDURE TRAY) ×3 IMPLANT
SET HIGH PRES BAL DIL (LABEL)
SHEATH ACCESS URETERAL 38CM (SHEATH) IMPLANT
STENT URET 6FRX24 CONTOUR (STENTS) ×1 IMPLANT

## 2015-05-24 NOTE — Op Note (Signed)
[name], [name]           ACCOUNT NO.:  [id number]  MEDICAL RECORD NO.:  [id number]  LOCATION:                                 FACILITY:  PHYSICIAN:  Marshall Cork. Jeffie Pollock, M.D.         DATE OF BIRTH:  DATE OF PROCEDURE:  05/24/2015 DATE OF DISCHARGE:                              OPERATIVE REPORT   PROCEDURE:  Cystoscopy with bilateral retrograde pyelograms and interpretation, left ureteroscopic stone extraction with holmium laser and placement of left double-J stent.  PREOPERATIVE DIAGNOSIS:  Gross hematuria with possible left distal ureteral stone.  POSTOPERATIVE DIAGNOSIS:  Gross hematuria with left distal ureteral stone.  ANESTHESIA:  General.  SPECIMEN:  Stone.  DRAINS:  A 6-French x 24 cm double-J stent.  BLOOD LOSS:  None.  COMPLICATIONS:  None.  INDICATIONS:  Erica Burch is a 44 year old, white female, who has had a several week history of intermittent gross hematuria and some left flank pain.  A CT scan suggests a left distal ureteral stone.  She has not been able to pass the stone and continues to have intermittent symptoms.  FINDINGS AND PROCEDURE:  She was taken to the operating room where she was given Cipro.  A general anesthetic was induced.  She was placed in lithotomy position and fitted with PAS hose.  Her perineum and genitalia were prepped with Betadine solution.  She was draped in usual sterile fashion.  Cystoscopy was performed using a 23-French scope and 30-degree lens. Examination revealed a normal urethra.  The bladder wall was smooth and pale without tumor, stones, or inflammation.  Ureteral orifices were unremarkable.  The right ureteral orifice was cannulated with 5-French open-end catheter and contrast was instilled.  This revealed a normal ureter and intrarenal collecting system.  The 5-French open-end catheter was then inserted in the left ureteral orifice and contrast was instilled.  This revealed a filling defect in the intramural  ureter consistent with a small stone with proximal dilation.  I was unable to get contrast efflux beyond the proximal ureter.  At this point, a guidewire was passed to the kidney on the left and a 4.5-French semirigid ureteroscope was then inserted alongside the wire. The stone was visualized and initial attempt to remove it with a NGage basket intact was unsuccessful.  A 365 micron laser fiber was then inserted through the scope.  The laser was set on 0.5 watts and 10 hertz and the stone was engaged.  It was broken into manageable fragments which were then removed using the NGage basket.  Once final inspection up to just above the mid ureter was performed and no significant residual stone fragments were noted.  The ureteroscope was removed and the cystoscope was reinserted over the wire.  A 6-French 24 cm contour double-J stent with string was inserted to the kidney under fluoroscopic guidance.  The wire was removed leaving good coil in the kidney and good coil in the bladder.  The bladder was drained leaving the stent string exiting urethra.  The string was then tied close to the meatus, trimmed and tucked vaginally.  A B and O suppository was placed.  The patient was taken down from the lithotomy position.  Her anesthetic was reversed.  She was moved to recovery room in stable condition.  There were no complications.     Marshall Cork. Jeffie Pollock, M.D.     JJW/MEDQ  D:  05/24/2015  T:  05/24/2015  Job:  [id number]

## 2015-05-24 NOTE — Transfer of Care (Signed)
Immediate Anesthesia Transfer of Care Note  Patient: Erica Burch  Procedure(s) Performed: Procedure(s): CYSTOSCOPY WITH BILATERAL RETROGRADE PYELOGRAM, LEFT URETEROSCOPY WITH STONE EXTRACTION AND STENT PLACEMENT (Bilateral) HOLMIUM LASER APPLICATION  Patient Location: PACU  Anesthesia Type:General  Level of Consciousness: awake, alert , oriented and patient cooperative  Airway & Oxygen Therapy: Patient Spontanous Breathing and Patient connected to nasal cannula oxygen  Post-op Assessment: Report given to RN and Post -op Vital signs reviewed and stable  Post vital signs: Reviewed and stable  Last Vitals:  Filed Vitals:   05/24/15 0625  BP: 147/89  Pulse: 68  Temp: 37.1 C  Resp: 16    Complications: No apparent anesthesia complications

## 2015-05-24 NOTE — Anesthesia Preprocedure Evaluation (Addendum)
Anesthesia Evaluation  Patient identified by MRN, date of birth, ID band Patient awake    Reviewed: Allergy & Precautions, H&P , NPO status , Patient's Chart, lab work & pertinent test results  Airway Mallampati: II  TM Distance: >3 FB Neck ROM: full    Dental no notable dental hx. (+) Teeth Intact, Dental Advisory Given   Pulmonary neg pulmonary ROS, former smoker,  breath sounds clear to auscultation  Pulmonary exam normal       Cardiovascular Exercise Tolerance: Good + Peripheral Vascular Disease negative cardio ROS  Rhythm:regular Rate:Normal  Splenic artery aneurysm - small   Neuro/Psych negative neurological ROS  negative psych ROS   GI/Hepatic negative GI ROS, Neg liver ROS,   Endo/Other  negative endocrine ROS  Renal/GU negative Renal ROSCalculus  negative genitourinary   Musculoskeletal negative musculoskeletal ROS (+)   Abdominal   Peds  Hematology negative hematology ROS (+)   Anesthesia Other Findings   Reproductive/Obstetrics negative OB ROS                           Anesthesia Physical Anesthesia Plan  ASA: II  Anesthesia Plan: General   Post-op Pain Management:    Induction: Intravenous  Airway Management Planned: LMA  Additional Equipment:   Intra-op Plan:   Post-operative Plan:   Informed Consent: I have reviewed the patients History and Physical, chart, labs and discussed the procedure including the risks, benefits and alternatives for the proposed anesthesia with the patient or authorized representative who has indicated his/her understanding and acceptance.   Dental advisory given  Plan Discussed with: CRNA and Anesthesiologist  Anesthesia Plan Comments:         Anesthesia Quick Evaluation

## 2015-05-24 NOTE — Discharge Instructions (Addendum)
CYSTOSCOPY HOME CARE INSTRUCTIONS  Activity: Rest for the remainder of the day.  Do not drive or operate equipment today.  You may resume normal activities in one to two days as instructed by your physician.   Meals: Drink plenty of liquids and eat light foods such as gelatin or soup this evening.  You may return to a normal meal plan tomorrow.  Return to Work: You may return to work in one to two days or as instructed by your physician.  Special Instructions / Symptoms: Call your physician if any of these symptoms occur:   -persistent or heavy bleeding  -bleeding which continues after first few urination  -large blood clots that are difficult to pass  -urine stream diminishes or stops completely  -fever equal to or higher than 101 degrees Farenheit.  -cloudy urine with a strong, foul odor  -severe pain  Females should always wipe from front to back after elimination.  You may feel some burning pain when you urinate.  This should disappear with time.  Applying moist heat to the lower abdomen or a hot tub bath may help relieve the pain. \     [facility] [phone] Post Ureteroscopy With or Without Stent Instructions  Definitions:  Ureter: The duct that transports urine from the kidney to the bladder. Stent:   A plastic hollow tube that is placed into the ureter, from the kidney to the                 bladder to prevent the ureter from swelling shut.  GENERAL INSTRUCTIONS:  Despite the fact that no skin incisions were used, the area around the ureter and bladder is raw and irritated. The stent is a foreign body which will further irritate the bladder wall. This irritation is manifested by increased frequency of urination, both day and night, and by an increase in the urge to urinate. In some, the urge to urinate is present almost always. Sometimes the urge is strong enough that you may not be able to stop yourself from urinating. The only real cure is to remove  the stent and then give time for the bladder wall to heal which can't be done until the danger of the ureter swelling shut has passed, which varies.  You may see some blood in your urine while the stent is in place and a few days afterwards. Do not be alarmed, even if the urine was clear for a while. Get off your feet and drink lots of fluids until clearing occurs. If you start to pass clots or don't improve, call us.  DIET: You may return to your normal diet immediately. Because of the raw surface of your bladder, alcohol, spicy foods, acid type foods and drinks with caffeine may cause irritation or frequency and should be used in moderation. To keep your urine flowing freely and to avoid constipation, drink plenty of fluids during the day ( 8-10 glasses ). Tip: Avoid cranberry juice because it is very acidic.  ACTIVITY: Your physical activity doesn't need to be restricted. However, if you are very active, you may see some blood in your urine. We suggest that you reduce your activity under these circumstances until the bleeding has stopped.  BOWELS: It is important to keep your bowels regular during the postoperative period. Straining with bowel movements can cause bleeding. A bowel movement every other day is reasonable. Use a mild laxative if needed, such as Milk of Magnesia 2-3 tablespoons, or 2 Dulcolax tablets.  Call if you continue to have problems. If you have been taking narcotics for pain, before, during or after your surgery, you may be constipated. Take a laxative if necessary.   MEDICATION: You should resume your pre-surgery medications unless told not to. In addition you will often be given an antibiotic to prevent infection. These should be taken as prescribed until the bottles are finished unless you are having an unusual reaction to one of the drugs.  PROBLEMS YOU SHOULD REPORT TO [place]:  Fevers over 100.5 Fahrenheit.  Heavy bleeding, or clots ( See above notes about blood in urine  ).  Inability to urinate.  Drug reactions ( hives, rash, nausea, vomiting, diarrhea ).  Severe burning or pain with urination that is not improving.  FOLLOW-UP: You will need a follow-up appointment to monitor your progress. Call for this appointment at the number listed above. Usually the first appointment will be about three to fourteen days after your surgery.      Post Anesthesia Home Care Instructions  Activity: Get plenty of rest for the remainder of the day. A responsible adult should stay with you for 24 hours following the procedure.  For the next 24 hours, DO NOT: -Drive a car -Paediatric nurse -Drink alcoholic beverages -Take any medication unless instructed by your physician -Make any legal decisions or sign important papers.  Meals: Start with liquid foods such as gelatin or soup. Progress to regular foods as tolerated. Avoid greasy, spicy, heavy foods. If nausea and/or vomiting occur, drink only clear liquids until the nausea and/or vomiting subsides. Call your physician if vomiting continues.  Special Instructions/Symptoms: Your throat may feel dry or sore from the anesthesia or the breathing tube placed in your throat during surgery. If this causes discomfort, gargle with warm salt water. The discomfort should disappear within 24 hours.  If you had a scopolamine patch placed behind your ear for the management of post- operative nausea and/or vomiting:  1. The medication in the patch is effective for 72 hours, after which it should be removed.  Wrap patch in a tissue and discard in the trash. Wash hands thoroughly with soap and water. 2. You may remove the patch earlier than 72 hours if you experience unpleasant side effects which may include dry mouth, dizziness or visual disturbances. 3. Avoid touching the patch. Wash your hands with soap and water after contact with the patch.

## 2015-05-24 NOTE — Anesthesia Postprocedure Evaluation (Signed)
  Anesthesia Post-op Note  Patient: Erica Burch  Procedure(s) Performed: Procedure(s) (LRB): CYSTOSCOPY WITH BILATERAL RETROGRADE PYELOGRAM, LEFT URETEROSCOPY WITH STONE EXTRACTION AND STENT PLACEMENT (Bilateral) HOLMIUM LASER APPLICATION  Patient Location: PACU  Anesthesia Type: General  Level of Consciousness: awake and alert   Airway and Oxygen Therapy: Patient Spontanous Breathing  Post-op Pain: mild  Post-op Assessment: Post-op Vital signs reviewed, Patient's Cardiovascular Status Stable, Respiratory Function Stable, Patent Airway and No signs of Nausea or vomiting  Last Vitals:  Filed Vitals:   05/24/15 0900  BP: 110/72  Pulse: 51  Temp:   Resp: 13    Post-op Vital Signs: stable   Complications: No apparent anesthesia complications

## 2015-05-24 NOTE — Anesthesia Procedure Notes (Signed)
Procedure Name: LMA Insertion Date/Time: 05/24/2015 7:37 AM Performed by: Wanita Chamberlain Pre-anesthesia Checklist: Patient identified, Timeout performed, Emergency Drugs available, Suction available and Patient being monitored Patient Re-evaluated:Patient Re-evaluated prior to inductionOxygen Delivery Method: Circle system utilized Preoxygenation: Pre-oxygenation with 100% oxygen Intubation Type: IV induction Ventilation: Mask ventilation without difficulty LMA: LMA inserted LMA Size: 4.0 Number of attempts: 1 Airway Equipment and Method: Bite block Tube secured with: Tape Dental Injury: Teeth and Oropharynx as per pre-operative assessment

## 2015-05-24 NOTE — Interval H&P Note (Signed)
History and Physical Interval Note:  05/24/2015 7:25 AM  Erica Burch  has presented today for surgery, with the diagnosis of gross hematuria, possible ureteral stone  The various methods of treatment have been discussed with the patient and family. After consideration of risks, benefits and other options for treatment, the patient has consented to  Procedure(s): CYSTOSCOPY WITH BILATERAL RETROGRADE PYELOGRAM, POSSIBLE LEFT URETEROSCOPY WITH STONE EXTRACTION AND STENT PLACEMENT (Bilateral) as a surgical intervention .  The patient's history has been reviewed, patient examined, no change in status, stable for surgery.  I have reviewed the patient's chart and labs.  Questions were answered to the patient's satisfaction.     [name] J

## 2015-05-24 NOTE — Brief Op Note (Signed)
05/24/2015  8:05 AM  PATIENT:  Erica Burch  44 y.o. female  PRE-OPERATIVE DIAGNOSIS:  gross hematuria, possible ureteral stone  POST-OPERATIVE DIAGNOSIS:  gross hematuria, left distal stone  PROCEDURE:  Procedure(s): CYSTOSCOPY WITH BILATERAL RETROGRADE PYELOGRAM, LEFT URETEROSCOPY WITH STONE EXTRACTION AND STENT PLACEMENT (Bilateral) HOLMIUM LASER APPLICATION  SURGEON:  Surgeon(s) and Role:    * Irine Seal, MD - Primary  PHYSICIAN ASSISTANT:   ASSISTANTS: none   ANESTHESIA:   general  EBL:     BLOOD ADMINISTERED:none  DRAINS: 6 x 24cm left JJ stent   LOCAL MEDICATIONS USED:  NONE  SPECIMEN:  Source of Specimen:  ureteral stone  DISPOSITION OF SPECIMEN:  to patient to bring to office  COUNTS:  YES  TOURNIQUET:  * No tourniquets in log *  DICTATION: .Other Dictation: Dictation Number [id number]  PLAN OF CARE: Discharge to home after PACU  PATIENT DISPOSITION:  PACU - hemodynamically stable.   Delay start of Pharmacological VTE agent (>24hrs) due to surgical blood loss or risk of bleeding: not applicable

## 2015-05-25 ENCOUNTER — Encounter (HOSPITAL_BASED_OUTPATIENT_CLINIC_OR_DEPARTMENT_OTHER): Payer: Self-pay | Admitting: Urology

## 2015-07-26 ENCOUNTER — Encounter: Payer: Self-pay | Admitting: Gynecology

## 2015-07-26 ENCOUNTER — Ambulatory Visit (INDEPENDENT_AMBULATORY_CARE_PROVIDER_SITE_OTHER): Payer: BLUE CROSS/BLUE SHIELD | Admitting: Gynecology

## 2015-07-26 VITALS — BP 126/78 | Ht 64.5 in | Wt 156.0 lb

## 2015-07-26 DIAGNOSIS — Z01419 Encounter for gynecological examination (general) (routine) without abnormal findings: Secondary | ICD-10-CM

## 2015-07-26 DIAGNOSIS — Z15068 Genetic susceptibility to other malignant neoplasm of digestive system: Secondary | ICD-10-CM

## 2015-07-26 DIAGNOSIS — N958 Other specified menopausal and perimenopausal disorders: Secondary | ICD-10-CM | POA: Diagnosis not present

## 2015-07-26 DIAGNOSIS — Z1509 Genetic susceptibility to other malignant neoplasm: Secondary | ICD-10-CM

## 2015-07-26 DIAGNOSIS — E8940 Asymptomatic postprocedural ovarian failure: Secondary | ICD-10-CM

## 2015-07-26 DIAGNOSIS — Z1501 Genetic susceptibility to malignant neoplasm of breast: Secondary | ICD-10-CM | POA: Diagnosis not present

## 2015-07-26 LAB — CBC WITH DIFFERENTIAL/PLATELET
Basophils Absolute: 0 10*3/uL (ref 0.0–0.1)
Basophils Relative: 1 % (ref 0–1)
Eosinophils Absolute: 0 10*3/uL (ref 0.0–0.7)
Eosinophils Relative: 1 % (ref 0–5)
HEMATOCRIT: 37.7 % (ref 36.0–46.0)
HEMOGLOBIN: 13.2 g/dL (ref 12.0–15.0)
LYMPHS PCT: 37 % (ref 12–46)
Lymphs Abs: 1.6 10*3/uL (ref 0.7–4.0)
MCH: 29.3 pg (ref 26.0–34.0)
MCHC: 35 g/dL (ref 30.0–36.0)
MCV: 83.6 fL (ref 78.0–100.0)
MONO ABS: 0.3 10*3/uL (ref 0.1–1.0)
MONOS PCT: 8 % (ref 3–12)
MPV: 9.3 fL (ref 8.6–12.4)
NEUTROS ABS: 2.3 10*3/uL (ref 1.7–7.7)
Neutrophils Relative %: 53 % (ref 43–77)
Platelets: 248 10*3/uL (ref 150–400)
RBC: 4.51 MIL/uL (ref 3.87–5.11)
RDW: 13.8 % (ref 11.5–15.5)
WBC: 4.3 10*3/uL (ref 4.0–10.5)

## 2015-07-26 LAB — TSH: TSH: 3.211 u[IU]/mL (ref 0.350–4.500)

## 2015-07-26 LAB — COMPREHENSIVE METABOLIC PANEL
ALBUMIN: 4.3 g/dL (ref 3.6–5.1)
ALK PHOS: 66 U/L (ref 33–115)
ALT: 16 U/L (ref 6–29)
AST: 21 U/L (ref 10–30)
BILIRUBIN TOTAL: 0.6 mg/dL (ref 0.2–1.2)
BUN: 12 mg/dL (ref 7–25)
CALCIUM: 9.1 mg/dL (ref 8.6–10.2)
CO2: 27 mmol/L (ref 20–31)
Chloride: 104 mmol/L (ref 98–110)
Creat: 0.76 mg/dL (ref 0.50–1.10)
Glucose, Bld: 95 mg/dL (ref 65–99)
Potassium: 4.2 mmol/L (ref 3.5–5.3)
Sodium: 140 mmol/L (ref 135–146)
Total Protein: 6.8 g/dL (ref 6.1–8.1)

## 2015-07-26 LAB — LIPID PANEL
CHOLESTEROL: 200 mg/dL (ref 125–200)
HDL: 52 mg/dL (ref >=46)
LDL CALC: 129 mg/dL (ref <130)
TRIGLYCERIDES: 93 mg/dL (ref <150)
Total CHOL/HDL Ratio: 3.8 Ratio (ref <=5.0)
VLDL: 19 mg/dL (ref <30)

## 2015-07-26 NOTE — Patient Instructions (Signed)

## 2015-07-26 NOTE — Progress Notes (Signed)
Erica Burch 09-Nov-1970 [id number]   History:    44 y.o.  for annual gyn exam with no complaints today. Patient requesting flu vaccine today. Review of her record indicated that in 2012 had a total abdominal hysterectomy and bilateral salpingo-oophorectomy do to her strong family history of primary breast cancer and personal history of positive BRCA1 mutation. Patient has seen Dr. Abran Cantor in 238fr consideration of prophylactic mastectomy and still has not decided. After consultation with her oncologist Dr.Khan, she had been placed on a very low dose transdermal estrogen consisting of Vivelle-Dot 0.075 mg transdermally but patient stated that she used only for 5 months and discontinued it. Patient states that her vasomotor symptoms are very far and in between. In August 2016 Dr.Wrenn urologist had performed a left ureteroscopy with stone extraction and patient has done well. Patient prior to her hysterectomy had no history of abnormal Pap smear. Patient's last bone density study in 2014 was normal.  Past medical history,surgical history, family history and social history were all reviewed and documented in the EPIC chart.  Gynecologic History Patient's last menstrual period was 04/28/2009. Contraception: status post hysterectomy Last Pap: 2012. Results were: normal Last mammogram: 2016. Results were: 3-D mammogram normal  Obstetric History OB History  Gravida Para Term Preterm AB SAB TAB Ectopic Multiple Living  3 3 3            # Outcome Date GA Lbr Len/2nd Weight Sex Delivery Anes PTL Lv  3 Term     M Vag-Spont     2 Term     F Vag-Spont     1 Term     F Vag-Spont          ROS: A ROS was performed and pertinent positives and negatives are included in the history.  GENERAL: No fevers or chills. HEENT: No change in vision, no earache, sore throat or sinus congestion. NECK: No pain or stiffness. CARDIOVASCULAR: No chest pain or pressure. No palpitations. PULMONARY: No shortness  of breath, cough or wheeze. GASTROINTESTINAL: No abdominal pain, nausea, vomiting or diarrhea, melena or bright red blood per rectum. GENITOURINARY: No urinary frequency, urgency, hesitancy or dysuria. MUSCULOSKELETAL: No joint or muscle pain, no back pain, no recent trauma. DERMATOLOGIC: No rash, no itching, no lesions. ENDOCRINE: No polyuria, polydipsia, no heat or cold intolerance. No recent change in weight. HEMATOLOGICAL: No anemia or easy bruising or bleeding. NEUROLOGIC: No headache, seizures, numbness, tingling or weakness. PSYCHIATRIC: No depression, no loss of interest in normal activity or change in sleep pattern.     Exam: chaperone present  BP 126/78 mmHg  Ht 5' 4.5" (1.638 m)  Wt 156 lb (70.761 kg)  BMI 26.37 kg/m2  LMP 04/28/2009  Body mass index is 26.37 kg/(m^2).  General appearance : Well developed well nourished female. No acute distress HEENT: Eyes: no retinal hemorrhage or exudates,  Neck supple, trachea midline, no carotid bruits, no thyroidmegaly Lungs: Clear to auscultation, no rhonchi or wheezes, or rib retractions  Heart: Regular rate and rhythm, no murmurs or gallops Breast:Examined in sitting and supine position were symmetrical in appearance, no palpable masses or tenderness,  no skin retraction, no nipple inversion, no nipple discharge, no skin discoloration, no axillary or supraclavicular lymphadenopathy Abdomen: no palpable masses or tenderness, no rebound or guarding Extremities: no edema or skin discoloration or tenderness  Pelvic:  Bartholin, Urethra, Skene Glands: Within normal limits             Vagina: No gross  lesions or discharge  Cervix:Absent  Uterus  absent  Adnexa  Without masses or tenderness  Anus and perineum  normal   Rectovaginal  normal sphincter tone without palpated masses or tenderness             Hemoccult not indicated    Assessment/Plan:  44 y.o. female for annual exam status post total abdominal hysterectomy with bilateral  salpingo-oophorectomy in 2012 as a result of strong first-line relatives with breast cancer and patient herself with positive BRCA1 gene mutation. Patient is still pondering idea of proceeding with prophylactic mastectomy. Patient CA 125 was normal in 2013.We had discussed before her surgery that based on her being a BRCA1 carrier she had a 60% risk of developing ovarian cancer. We had also discussed my conversation with her oncologist who had stated preoperatively that her risk of breast cancer would be reduced by 30-40% by removing her ovaries. Last year she had spoken with the general surgeon Dr.Hoxsworth who had quoted her that with her history prophylactic mastectomy with reduce her risk of breast cancer by 90%. She is fully aware that having the BRCA1 gene mutation she has a 75-80% risk of breast cancer. She is taking this under consideration . Patient to schedule bone density in the month of December. She was encouraged to do her monthly breast exam. Of encourage her to take vitamin D 2000 units daily. We discussed importance of weightbearing exercise for osteoporosis prevention. Pap smear not indicated according to the new guidelines. Patient received the flu vaccine today.     Terrance Mass MD, 11:56 AM 07/26/2015

## 2015-07-27 ENCOUNTER — Other Ambulatory Visit: Payer: Self-pay | Admitting: *Deleted

## 2015-07-27 DIAGNOSIS — E559 Vitamin D deficiency, unspecified: Secondary | ICD-10-CM

## 2015-07-27 LAB — URINALYSIS W MICROSCOPIC + REFLEX CULTURE
Bacteria, UA: NONE SEEN [HPF]
Bilirubin Urine: NEGATIVE
Casts: NONE SEEN [LPF]
Crystals: NONE SEEN [HPF]
GLUCOSE, UA: NEGATIVE
Ketones, ur: NEGATIVE
LEUKOCYTES UA: NEGATIVE
NITRITE: NEGATIVE
PH: 7 (ref 5.0–8.0)
Protein, ur: NEGATIVE
SPECIFIC GRAVITY, URINE: 1.017 (ref 1.001–1.035)
YEAST: NONE SEEN [HPF]

## 2015-07-27 LAB — VITAMIN D 25 HYDROXY (VIT D DEFICIENCY, FRACTURES): Vit D, 25-Hydroxy: 25 ng/mL — ABNORMAL LOW (ref 30–100)

## 2015-07-27 MED ORDER — VITAMIN D (ERGOCALCIFEROL) 1.25 MG (50000 UNIT) PO CAPS: ORAL_CAPSULE | ORAL | Status: DC

## 2015-07-28 LAB — URINE CULTURE: Colony Count: 75000

## 2015-07-30 ENCOUNTER — Other Ambulatory Visit: Payer: Self-pay | Admitting: *Deleted

## 2015-07-30 MED ORDER — NITROFURANTOIN MONOHYD MACRO 100 MG PO CAPS: 100.0000 mg | ORAL_CAPSULE | Freq: Two times a day (BID) | ORAL | Status: DC

## 2015-09-06 ENCOUNTER — Other Ambulatory Visit: Payer: Self-pay | Admitting: Gynecology

## 2015-09-06 ENCOUNTER — Ambulatory Visit (INDEPENDENT_AMBULATORY_CARE_PROVIDER_SITE_OTHER): Payer: BLUE CROSS/BLUE SHIELD

## 2015-09-06 DIAGNOSIS — E8940 Asymptomatic postprocedural ovarian failure: Secondary | ICD-10-CM

## 2015-09-06 DIAGNOSIS — Z13820 Encounter for screening for osteoporosis: Secondary | ICD-10-CM

## 2015-09-06 DIAGNOSIS — N958 Other specified menopausal and perimenopausal disorders: Secondary | ICD-10-CM

## 2015-09-30 HISTORY — PX: MASTECTOMY: SHX3

## 2015-11-01 ENCOUNTER — Other Ambulatory Visit: Payer: Self-pay

## 2015-11-01 DIAGNOSIS — Z1231 Encounter for screening mammogram for malignant neoplasm of breast: Secondary | ICD-10-CM

## 2015-11-29 ENCOUNTER — Ambulatory Visit
Admission: RE | Admit: 2015-11-29 | Discharge: 2015-11-29 | Disposition: A | Discharge status: Home / Self Care | Payer: BLUE CROSS/BLUE SHIELD | Source: Ambulatory Visit

## 2015-11-29 DIAGNOSIS — Z1231 Encounter for screening mammogram for malignant neoplasm of breast: Secondary | ICD-10-CM

## 2015-12-04 ENCOUNTER — Other Ambulatory Visit: Payer: Self-pay | Admitting: Gynecology

## 2015-12-04 DIAGNOSIS — R928 Other abnormal and inconclusive findings on diagnostic imaging of breast: Secondary | ICD-10-CM

## 2015-12-10 ENCOUNTER — Ambulatory Visit
Admission: RE | Admit: 2015-12-10 | Discharge: 2015-12-10 | Disposition: A | Discharge status: Home / Self Care | Payer: BLUE CROSS/BLUE SHIELD | Source: Ambulatory Visit | Attending: Gynecology | Admitting: Gynecology

## 2015-12-10 ENCOUNTER — Other Ambulatory Visit: Payer: Self-pay | Admitting: Gynecology

## 2015-12-10 DIAGNOSIS — N6310 Unspecified lump in the right breast, unspecified quadrant: Secondary | ICD-10-CM

## 2015-12-10 DIAGNOSIS — R928 Other abnormal and inconclusive findings on diagnostic imaging of breast: Secondary | ICD-10-CM

## 2015-12-24 ENCOUNTER — Encounter: Payer: Self-pay | Admitting: Gynecology

## 2015-12-25 NOTE — Telephone Encounter (Signed)
Dr. Chancy Milroy is no longer there. You can staff message Dr. Jana Hakim if you would like to.

## 2015-12-28 ENCOUNTER — Telehealth: Payer: Self-pay | Admitting: *Deleted

## 2015-12-28 NOTE — Telephone Encounter (Signed)
Please set up appointment with Dr. Donne Hazel for this patient who needs prophylactic bilateral mastectomy due to BRCA mutation. Thanks

## 2015-12-28 NOTE — Telephone Encounter (Signed)
Notes faxed to [facility] they will contact pt to schedule.

## 2015-12-28 NOTE — Telephone Encounter (Signed)
-----  Message from Ramond Craver, [place] sent at 12/27/2015  2:48 PM EDT ----- Regarding: referral to general surgeon Advice request note :     "I called [facility] to schedule an appt and they said I would have to get my Dr to request appt. I would like to see Dr. Rolm Bookbinder from that practice to do my prophylactic mastectomy and I've already met with plastic/reconstruction surgeon Dr. Iran Planas. Can you help me with this?      I know back in 2011 you guys set me up an appt there but I wasn't quite ready to do the surgery then."

## 2015-12-28 NOTE — Telephone Encounter (Signed)
Dr.Fernandez per note on OV 07/26/15 "Patient is still pondering idea of proceeding with prophylactic mastectomy" pt is ready to schedule, appt. will be scheduled

## 2016-01-01 NOTE — Telephone Encounter (Signed)
Appointment on 01/24/16 @ 3:00pm with Dr.Wakefield

## 2016-01-14 DIAGNOSIS — N200 Calculus of kidney: Secondary | ICD-10-CM | POA: Diagnosis not present

## 2016-01-14 DIAGNOSIS — Z0000 Encounter for general adult medical examination without abnormal findings: Secondary | ICD-10-CM | POA: Diagnosis not present

## 2016-01-24 ENCOUNTER — Other Ambulatory Visit: Payer: Self-pay | Admitting: General Surgery

## 2016-01-24 DIAGNOSIS — Z1502 Genetic susceptibility to malignant neoplasm of ovary: Secondary | ICD-10-CM | POA: Diagnosis not present

## 2016-01-24 DIAGNOSIS — Z1509 Genetic susceptibility to other malignant neoplasm: Principal | ICD-10-CM

## 2016-01-24 DIAGNOSIS — Z1501 Genetic susceptibility to malignant neoplasm of breast: Secondary | ICD-10-CM

## 2016-01-28 ENCOUNTER — Other Ambulatory Visit: Payer: BLUE CROSS/BLUE SHIELD

## 2016-02-04 ENCOUNTER — Ambulatory Visit
Admission: RE | Admit: 2016-02-04 | Discharge: 2016-02-04 | Disposition: A | Discharge status: Home / Self Care | Payer: BLUE CROSS/BLUE SHIELD | Source: Ambulatory Visit | Attending: General Surgery | Admitting: General Surgery

## 2016-02-04 DIAGNOSIS — Z1509 Genetic susceptibility to other malignant neoplasm: Principal | ICD-10-CM

## 2016-02-04 DIAGNOSIS — Z1501 Genetic susceptibility to malignant neoplasm of breast: Secondary | ICD-10-CM

## 2016-02-04 DIAGNOSIS — N6489 Other specified disorders of breast: Secondary | ICD-10-CM | POA: Diagnosis not present

## 2016-02-04 MED ORDER — GADOBENATE DIMEGLUMINE 529 MG/ML IV SOLN
14.0000 mL | Freq: Once | INTRAVENOUS | Status: AC | PRN
  Administered 2016-02-04: 14 mL via INTRAVENOUS

## 2016-02-11 DIAGNOSIS — Z1501 Genetic susceptibility to malignant neoplasm of breast: Secondary | ICD-10-CM | POA: Diagnosis not present

## 2016-02-11 DIAGNOSIS — Z803 Family history of malignant neoplasm of breast: Secondary | ICD-10-CM | POA: Diagnosis not present

## 2016-02-11 DIAGNOSIS — Z1502 Genetic susceptibility to malignant neoplasm of ovary: Secondary | ICD-10-CM | POA: Diagnosis not present

## 2016-02-11 NOTE — H&P (Signed)
  Subjective:    Patient ID: Erica Burch is a 45 y.o. female.  HPI  Here for follow up discussion prior to bilateral nipple sparing mastectomies. 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 Mother underwent implant based reconstruction with latissimus on right and direct to implant on left, no history expanders for her.  Patient last MMG 11/2015, had biopsy on right and benign. One prior lumpectomy on left at [facility], benign. Since last visit has had screening MRI, negative.  Current 36 D, desires smaller. Wt up 10 lbs after hysterectomy. Works [employer] homes.   Review of Systems    Objective:   Physical Exam  Cardiovascular: Normal rate and regular rhythm.  Pulmonary/Chest: Effort normal and breath sounds normal.  Abdominal:  No hernia, redundant tissue without panniculus  Lymphadenopathy:  She has no axillary adenopathy.   Grade 1 ptosis bilateral,no masses, scar left inferior pole vertical SN to nipple R 26.5 L 27 cm BW R 15 L 15 cm Nipple to IMF R 8 L 7 cm     Assessment:     BRCA1    Plan:     Plan NSM with bilateral TE placement, possible acellular dermis. Reviewed that NSM will be asensate and not stimulate.   Reviewed IMF incision, drains, post procedure limitations and visits. Reviewed implant specific risks including rupture, infection requiring surgery or removal, capsular contracture, MRI surveillance of silicone implants.Discussed use of acellular dermis in reconstruction, cadaveric source. Discussed additional risks including but not limited to bleeding, infection, anesthesia, asymmetry, DVT/PE, wound healing problems, extrusion implant, seroma, hematoma, damage to deeper structures, cardiopulmonary complications reviewed.  Irene Limbo, MD [facility] Plastic & Reconstructive Surgery [phone]

## 2016-02-12 ENCOUNTER — Encounter: Payer: Self-pay | Admitting: Gynecology

## 2016-02-15 ENCOUNTER — Other Ambulatory Visit: Payer: Self-pay | Admitting: General Surgery

## 2016-02-19 ENCOUNTER — Encounter (HOSPITAL_COMMUNITY)
Admission: RE | Admit: 2016-02-19 | Discharge: 2016-02-19 | Disposition: A | Discharge status: Home / Self Care | Payer: BLUE CROSS/BLUE SHIELD | Source: Ambulatory Visit | Attending: General Surgery | Admitting: General Surgery

## 2016-02-19 ENCOUNTER — Other Ambulatory Visit (HOSPITAL_COMMUNITY): Payer: Self-pay | Admitting: *Deleted

## 2016-02-19 ENCOUNTER — Encounter (HOSPITAL_COMMUNITY): Payer: Self-pay

## 2016-02-19 DIAGNOSIS — Z01812 Encounter for preprocedural laboratory examination: Secondary | ICD-10-CM | POA: Insufficient documentation

## 2016-02-19 HISTORY — DX: Anxiety disorder, unspecified: F41.9

## 2016-02-19 LAB — CBC WITH DIFFERENTIAL/PLATELET
BASOS ABS: 0 10*3/uL (ref 0.0–0.1)
BASOS PCT: 0 %
Eosinophils Absolute: 0 10*3/uL (ref 0.0–0.7)
Eosinophils Relative: 1 %
HEMATOCRIT: 37.5 % (ref 36.0–46.0)
Hemoglobin: 12.9 g/dL (ref 12.0–15.0)
LYMPHS PCT: 41 %
Lymphs Abs: 2.1 10*3/uL (ref 0.7–4.0)
MCH: 28.2 pg (ref 26.0–34.0)
MCHC: 34.4 g/dL (ref 30.0–36.0)
MCV: 81.9 fL (ref 78.0–100.0)
MONO ABS: 0.4 10*3/uL (ref 0.1–1.0)
Monocytes Relative: 8 %
NEUTROS ABS: 2.6 10*3/uL (ref 1.7–7.7)
Neutrophils Relative %: 50 %
PLATELETS: 279 10*3/uL (ref 150–400)
RBC: 4.58 MIL/uL (ref 3.87–5.11)
RDW: 12.2 % (ref 11.5–15.5)
WBC: 5.1 10*3/uL (ref 4.0–10.5)

## 2016-02-19 LAB — BASIC METABOLIC PANEL
ANION GAP: 4 — AB (ref 5–15)
BUN: 10 mg/dL (ref 6–20)
CALCIUM: 9.4 mg/dL (ref 8.9–10.3)
CO2: 27 mmol/L (ref 22–32)
Chloride: 108 mmol/L (ref 101–111)
Creatinine, Ser: 0.79 mg/dL (ref 0.44–1.00)
GLUCOSE: 94 mg/dL (ref 65–99)
POTASSIUM: 3.7 mmol/L (ref 3.5–5.1)
SODIUM: 139 mmol/L (ref 135–145)

## 2016-02-19 NOTE — Pre-Procedure Instructions (Signed)
    EDYTHA SWARTZFAGER  02/19/2016      [facility] [place], [place] [address] [place] [address] Phone: [phone] Fax: [phone]  [facility] [place] [place] [address] Phone: [phone] Fax: [phone]    Your procedure is scheduled on Tuesday May 30th  Report to [facility] Admitting at 9:00 am  Call this number if you have problems the morning of surgery:  [phone]   Remember:  Do not eat food or drink liquids after midnight.  Take these medicines the morning of surgery with A SIP OF WATER: Xanax if needed Stop taking all aspirin or aspirin containing products, Nsaids (such as aleve, ibuprofen, naproxen, motrin, advil), vitamins and vitamin supplements (including your citracal) starting this Friday.   Do not wear jewelry, make-up or nail polish.  Do not wear lotions, powders, or perfumes.  You may wear deodorant.  Do not shave 48 hours prior to surgery.   Do not bring valuables to the hospital.  [facility] is not responsible for any belongings or valuables.  Contacts, dentures or bridgework may not be worn into surgery.  Leave your suitcase in the car.  After surgery it may be brought to your room.  Special instructions:  Shower with CHG the night before and morning of surgery as instructed

## 2016-02-26 ENCOUNTER — Encounter (HOSPITAL_COMMUNITY): Payer: Self-pay | Admitting: *Deleted

## 2016-02-26 ENCOUNTER — Encounter (HOSPITAL_COMMUNITY)
Admission: RE | Disposition: A | Discharge status: Home / Self Care | Payer: Self-pay | Source: Ambulatory Visit | Attending: General Surgery

## 2016-02-26 ENCOUNTER — Observation Stay (HOSPITAL_COMMUNITY)
Admission: RE | Admit: 2016-02-26 | Discharge: 2016-02-27 | Disposition: A | Discharge status: Home / Self Care | Payer: BLUE CROSS/BLUE SHIELD | Source: Ambulatory Visit | Attending: General Surgery | Admitting: General Surgery

## 2016-02-26 ENCOUNTER — Ambulatory Visit (HOSPITAL_COMMUNITY): Payer: BLUE CROSS/BLUE SHIELD | Admitting: Certified Registered Nurse Anesthetist

## 2016-02-26 DIAGNOSIS — Z1501 Genetic susceptibility to malignant neoplasm of breast: Secondary | ICD-10-CM | POA: Diagnosis present

## 2016-02-26 DIAGNOSIS — Z803 Family history of malignant neoplasm of breast: Secondary | ICD-10-CM | POA: Insufficient documentation

## 2016-02-26 DIAGNOSIS — Z4001 Encounter for prophylactic removal of breast: Secondary | ICD-10-CM | POA: Diagnosis not present

## 2016-02-26 DIAGNOSIS — Z87891 Personal history of nicotine dependence: Secondary | ICD-10-CM | POA: Insufficient documentation

## 2016-02-26 DIAGNOSIS — F419 Anxiety disorder, unspecified: Secondary | ICD-10-CM | POA: Diagnosis not present

## 2016-02-26 DIAGNOSIS — Z1509 Genetic susceptibility to other malignant neoplasm: Secondary | ICD-10-CM

## 2016-02-26 DIAGNOSIS — N6011 Diffuse cystic mastopathy of right breast: Secondary | ICD-10-CM | POA: Diagnosis not present

## 2016-02-26 DIAGNOSIS — Z1502 Genetic susceptibility to malignant neoplasm of ovary: Secondary | ICD-10-CM | POA: Diagnosis not present

## 2016-02-26 DIAGNOSIS — G8918 Other acute postprocedural pain: Secondary | ICD-10-CM | POA: Diagnosis not present

## 2016-02-26 DIAGNOSIS — N6012 Diffuse cystic mastopathy of left breast: Secondary | ICD-10-CM | POA: Diagnosis not present

## 2016-02-26 DIAGNOSIS — Z79899 Other long term (current) drug therapy: Secondary | ICD-10-CM | POA: Diagnosis not present

## 2016-02-26 HISTORY — PX: BREAST RECONSTRUCTION WITH PLACEMENT OF TISSUE EXPANDER AND FLEX HD (ACELLULAR HYDRATED DERMIS): SHX6295

## 2016-02-26 HISTORY — PX: NIPPLE SPARING MASTECTOMY: SHX6537

## 2016-02-26 SURGERY — MASTECTOMY, NIPPLE SPARING
Anesthesia: Regional | Site: Breast | Laterality: Bilateral

## 2016-02-26 MED ORDER — 0.9 % SODIUM CHLORIDE (POUR BTL) OPTIME
TOPICAL | Status: DC | PRN
  Administered 2016-02-26: 1000 mL

## 2016-02-26 MED ORDER — LIDOCAINE 2% (20 MG/ML) 5 ML SYRINGE
INTRAMUSCULAR | Status: DC | PRN
  Administered 2016-02-26: 60 mg via INTRAVENOUS

## 2016-02-26 MED ORDER — CEFAZOLIN SODIUM-DEXTROSE 2-4 GM/100ML-% IV SOLN
2.0000 g | INTRAVENOUS | Status: DC
  Filled 2016-02-26: qty 100

## 2016-02-26 MED ORDER — MIDAZOLAM HCL 2 MG/2ML IJ SOLN
2.0000 mg | Freq: Once | INTRAMUSCULAR | Status: DC
Start: 2016-02-26 — End: 2016-02-27
  Filled 2016-02-26: qty 2

## 2016-02-26 MED ORDER — ROCURONIUM BROMIDE 50 MG/5ML IV SOLN
INTRAVENOUS | Status: AC
  Filled 2016-02-26: qty 1

## 2016-02-26 MED ORDER — OXYCODONE HCL 5 MG PO TABS
5.0000 mg | ORAL_TABLET | ORAL | Status: DC | PRN
  Administered 2016-02-26 – 2016-02-27 (×3): 10 mg via ORAL
  Filled 2016-02-26 (×3): qty 2

## 2016-02-26 MED ORDER — GLYCOPYRROLATE 0.2 MG/ML IV SOSY
PREFILLED_SYRINGE | INTRAVENOUS | Status: DC | PRN
  Administered 2016-02-26: .6 mg via INTRAVENOUS

## 2016-02-26 MED ORDER — CHLORHEXIDINE GLUCONATE 4 % EX LIQD: 1.0000 "application " | Freq: Once | CUTANEOUS | Status: DC

## 2016-02-26 MED ORDER — DEXTROSE 5 % IV SOLN
10.0000 mg | INTRAVENOUS | Status: DC | PRN
  Administered 2016-02-26: 10 ug/min via INTRAVENOUS

## 2016-02-26 MED ORDER — FENTANYL CITRATE (PF) 100 MCG/2ML IJ SOLN
INTRAMUSCULAR | Status: AC
  Filled 2016-02-26: qty 2

## 2016-02-26 MED ORDER — PROPOFOL 10 MG/ML IV BOLUS
INTRAVENOUS | Status: DC | PRN
  Administered 2016-02-26: 160 mg via INTRAVENOUS
  Administered 2016-02-26: 20 mg via INTRAVENOUS

## 2016-02-26 MED ORDER — METHOCARBAMOL 500 MG PO TABS
ORAL_TABLET | ORAL | Status: AC
  Filled 2016-02-26: qty 1

## 2016-02-26 MED ORDER — PHENYLEPHRINE 40 MCG/ML (10ML) SYRINGE FOR IV PUSH (FOR BLOOD PRESSURE SUPPORT)
PREFILLED_SYRINGE | INTRAVENOUS | Status: AC
  Filled 2016-02-26: qty 20

## 2016-02-26 MED ORDER — HYDROMORPHONE HCL 1 MG/ML IJ SOLN
INTRAMUSCULAR | Status: AC
  Filled 2016-02-26: qty 1

## 2016-02-26 MED ORDER — CEFAZOLIN SODIUM-DEXTROSE 2-4 GM/100ML-% IV SOLN
2.0000 g | Freq: Three times a day (TID) | INTRAVENOUS | Status: DC
  Administered 2016-02-26 – 2016-02-27 (×2): 2 g via INTRAVENOUS
  Filled 2016-02-26 (×3): qty 100

## 2016-02-26 MED ORDER — ONDANSETRON HCL 4 MG/2ML IJ SOLN
INTRAMUSCULAR | Status: DC | PRN
  Administered 2016-02-26 (×2): 4 mg via INTRAVENOUS

## 2016-02-26 MED ORDER — FENTANYL CITRATE (PF) 250 MCG/5ML IJ SOLN
INTRAMUSCULAR | Status: AC
  Filled 2016-02-26: qty 5

## 2016-02-26 MED ORDER — OXYCODONE HCL 5 MG PO TABS
5.0000 mg | ORAL_TABLET | Freq: Once | ORAL | Status: AC | PRN
Start: 2016-02-26 — End: 2016-02-26
  Administered 2016-02-26: 5 mg via ORAL

## 2016-02-26 MED ORDER — GLYCOPYRROLATE 0.2 MG/ML IV SOSY
PREFILLED_SYRINGE | INTRAVENOUS | Status: AC
  Filled 2016-02-26: qty 3

## 2016-02-26 MED ORDER — ONDANSETRON HCL 4 MG/2ML IJ SOLN: 4.0000 mg | Freq: Four times a day (QID) | INTRAMUSCULAR | Status: DC | PRN

## 2016-02-26 MED ORDER — SODIUM CHLORIDE 0.9 % IR SOLN
Status: DC | PRN
  Administered 2016-02-26: 1000 mL

## 2016-02-26 MED ORDER — DEXAMETHASONE SODIUM PHOSPHATE 10 MG/ML IJ SOLN
INTRAMUSCULAR | Status: DC | PRN
  Administered 2016-02-26: 10 mg via INTRAVENOUS
  Administered 2016-02-26: 1 mg via INTRAVENOUS

## 2016-02-26 MED ORDER — ROCURONIUM BROMIDE 10 MG/ML (PF) SYRINGE
PREFILLED_SYRINGE | INTRAVENOUS | Status: DC | PRN
  Administered 2016-02-26: 10 mg via INTRAVENOUS
  Administered 2016-02-26 (×5): 5 mg via INTRAVENOUS
  Administered 2016-02-26: 30 mg via INTRAVENOUS
  Administered 2016-02-26: 50 mg via INTRAVENOUS
  Administered 2016-02-26: 5 mg via INTRAVENOUS
  Administered 2016-02-26: 10 mg via INTRAVENOUS
  Administered 2016-02-26 (×2): 5 mg via INTRAVENOUS

## 2016-02-26 MED ORDER — NEOSTIGMINE METHYLSULFATE 5 MG/5ML IV SOSY
PREFILLED_SYRINGE | INTRAVENOUS | Status: DC | PRN
  Administered 2016-02-26: 4 mg via INTRAVENOUS

## 2016-02-26 MED ORDER — NEOSTIGMINE METHYLSULFATE 5 MG/5ML IV SOSY
PREFILLED_SYRINGE | INTRAVENOUS | Status: AC
  Filled 2016-02-26: qty 5

## 2016-02-26 MED ORDER — WHITE PETROLATUM GEL
Status: AC
  Filled 2016-02-26: qty 1

## 2016-02-26 MED ORDER — CEFAZOLIN SODIUM-DEXTROSE 2-4 GM/100ML-% IV SOLN
2.0000 g | INTRAVENOUS | Status: AC
  Administered 2016-02-26: 2 g via INTRAVENOUS

## 2016-02-26 MED ORDER — SODIUM CHLORIDE 0.9 % IV SOLN
INTRAVENOUS | Status: DC
  Administered 2016-02-26: 19:00:00 via INTRAVENOUS

## 2016-02-26 MED ORDER — OXYCODONE HCL 5 MG/5ML PO SOLN: 5.0000 mg | Freq: Once | ORAL | Status: AC | PRN

## 2016-02-26 MED ORDER — METHOCARBAMOL 500 MG PO TABS
500.0000 mg | ORAL_TABLET | Freq: Four times a day (QID) | ORAL | Status: DC | PRN
  Administered 2016-02-26 – 2016-02-27 (×3): 500 mg via ORAL
  Filled 2016-02-26 (×4): qty 1

## 2016-02-26 MED ORDER — FENTANYL CITRATE (PF) 100 MCG/2ML IJ SOLN
100.0000 ug | Freq: Once | INTRAMUSCULAR | Status: AC
  Administered 2016-02-26: 100 ug via INTRAVENOUS
  Filled 2016-02-26: qty 2

## 2016-02-26 MED ORDER — ALPRAZOLAM 0.5 MG PO TABS: 0.5000 mg | ORAL_TABLET | Freq: Two times a day (BID) | ORAL | Status: DC | PRN

## 2016-02-26 MED ORDER — HYDROMORPHONE HCL 1 MG/ML IJ SOLN
0.5000 mg | INTRAMUSCULAR | Status: DC | PRN
  Administered 2016-02-26 – 2016-02-27 (×2): 0.5 mg via INTRAVENOUS
  Filled 2016-02-26 (×2): qty 1

## 2016-02-26 MED ORDER — DEXAMETHASONE SODIUM PHOSPHATE 10 MG/ML IJ SOLN
INTRAMUSCULAR | Status: AC
  Filled 2016-02-26: qty 1

## 2016-02-26 MED ORDER — ACETAMINOPHEN 500 MG PO TABS
1000.0000 mg | ORAL_TABLET | Freq: Four times a day (QID) | ORAL | Status: DC
  Administered 2016-02-26 – 2016-02-27 (×4): 1000 mg via ORAL
  Filled 2016-02-26 (×4): qty 2

## 2016-02-26 MED ORDER — MIDAZOLAM HCL 2 MG/2ML IJ SOLN
INTRAMUSCULAR | Status: AC
  Administered 2016-02-26: 2 mg
  Filled 2016-02-26: qty 2

## 2016-02-26 MED ORDER — SODIUM CHLORIDE 0.9 % IV SOLN
INTRAVENOUS | Status: DC | PRN
  Administered 2016-02-26: 1000 mL

## 2016-02-26 MED ORDER — ONDANSETRON 4 MG PO TBDP: 4.0000 mg | ORAL_TABLET | Freq: Four times a day (QID) | ORAL | Status: DC | PRN

## 2016-02-26 MED ORDER — LACTATED RINGERS IV SOLN
INTRAVENOUS | Status: DC
  Administered 2016-02-26 (×3): via INTRAVENOUS

## 2016-02-26 MED ORDER — OXYCODONE HCL 5 MG PO TABS
ORAL_TABLET | ORAL | Status: AC
  Filled 2016-02-26: qty 1

## 2016-02-26 MED ORDER — FENTANYL CITRATE (PF) 100 MCG/2ML IJ SOLN
INTRAMUSCULAR | Status: DC | PRN
Start: 2016-02-26 — End: 2016-02-26
  Administered 2016-02-26: 25 ug via INTRAVENOUS
  Administered 2016-02-26: 150 ug via INTRAVENOUS
  Administered 2016-02-26: 25 ug via INTRAVENOUS

## 2016-02-26 MED ORDER — PHENYLEPHRINE 40 MCG/ML (10ML) SYRINGE FOR IV PUSH (FOR BLOOD PRESSURE SUPPORT)
PREFILLED_SYRINGE | INTRAVENOUS | Status: DC | PRN
  Administered 2016-02-26 (×4): 40 ug via INTRAVENOUS
  Administered 2016-02-26: 80 ug via INTRAVENOUS
  Administered 2016-02-26 (×5): 40 ug via INTRAVENOUS

## 2016-02-26 MED ORDER — ONDANSETRON HCL 4 MG/2ML IJ SOLN
INTRAMUSCULAR | Status: AC
  Filled 2016-02-26: qty 6

## 2016-02-26 MED ORDER — SODIUM CHLORIDE 0.9 % IV SOLN
Freq: Once | INTRAVENOUS | Status: DC
  Filled 2016-02-26: qty 1

## 2016-02-26 MED ORDER — HYDROMORPHONE HCL 1 MG/ML IJ SOLN
0.2500 mg | INTRAMUSCULAR | Status: DC | PRN
  Administered 2016-02-26 (×2): 0.5 mg via INTRAVENOUS

## 2016-02-26 SURGICAL SUPPLY — 70 items
ALLOGRAFT DERM CORTIV 4X12 (Tissue Mesh) ×2 IMPLANT
BAG DECANTER FOR FLEXI CONT (MISCELLANEOUS) ×2 IMPLANT
BINDER BREAST LRG (GAUZE/BANDAGES/DRESSINGS) IMPLANT
BINDER BREAST XLRG (GAUZE/BANDAGES/DRESSINGS) IMPLANT
CANISTER SUCTION 2500CC (MISCELLANEOUS) ×2 IMPLANT
CHLORAPREP W/TINT 26ML (MISCELLANEOUS) ×2 IMPLANT
CLSR STERI-STRIP ANTIMIC 1/2X4 (GAUZE/BANDAGES/DRESSINGS) ×2 IMPLANT
CONT SPEC 4OZ CLIKSEAL STRL BL (MISCELLANEOUS) ×2 IMPLANT
COVER SURGICAL LIGHT HANDLE (MISCELLANEOUS) ×2 IMPLANT
DRAIN CHANNEL 15F RND FF W/TCR (WOUND CARE) IMPLANT
DRAIN CHANNEL 19F RND (DRAIN) ×2 IMPLANT
DRAPE INCISE IOBAN 66X45 STRL (DRAPES) IMPLANT
DRAPE ORTHO SPLIT 77X108 STRL (DRAPES) ×4
DRAPE PROXIMA HALF (DRAPES) IMPLANT
DRAPE SURG ORHT 6 SPLT 77X108 (DRAPES) ×2 IMPLANT
DRAPE WARM FLUID 44X44 (DRAPE) ×2 IMPLANT
DRSG PAD ABDOMINAL 8X10 ST (GAUZE/BANDAGES/DRESSINGS) ×4 IMPLANT
DRSG TEGADERM 2-3/8X2-3/4 SM (GAUZE/BANDAGES/DRESSINGS) ×1 IMPLANT
DRSG TEGADERM 4X4.75 (GAUZE/BANDAGES/DRESSINGS) ×9 IMPLANT
ELECT BLADE 4.0 EZ CLEAN MEGAD (MISCELLANEOUS) ×2
ELECT BLADE 6.5 EXT (BLADE) ×1 IMPLANT
ELECT CAUTERY BLADE 6.4 (BLADE) ×2 IMPLANT
ELECT COATED BLADE 2.86 ST (ELECTRODE) ×2 IMPLANT
ELECT REM PT RETURN 9FT ADLT (ELECTROSURGICAL) ×2
ELECTRODE BLDE 4.0 EZ CLN MEGD (MISCELLANEOUS) ×1 IMPLANT
ELECTRODE REM PT RTRN 9FT ADLT (ELECTROSURGICAL) ×1 IMPLANT
EVACUATOR SILICONE 100CC (DRAIN) ×2 IMPLANT
EXPANDER TISSUE MX 400CC (Breast) IMPLANT
GAUZE SPONGE 4X4 12PLY STRL (GAUZE/BANDAGES/DRESSINGS) ×2 IMPLANT
GLOVE BIO SURGEON STRL SZ 6 (GLOVE) ×8 IMPLANT
GLOVE BIO SURGEON STRL SZ7 (GLOVE) ×3 IMPLANT
GLOVE BIOGEL PI IND STRL 7.0 (GLOVE) IMPLANT
GLOVE BIOGEL PI INDICATOR 7.0 (GLOVE) ×1
GLOVE ECLIPSE 6.5 STRL STRAW (GLOVE) ×2 IMPLANT
GLOVE SS BIOGEL STRL SZ 6.5 (GLOVE) IMPLANT
GLOVE SUPERSENSE BIOGEL SZ 6.5 (GLOVE) ×3
GOWN STRL REUS W/ TWL LRG LVL3 (GOWN DISPOSABLE) ×2 IMPLANT
GOWN STRL REUS W/TWL LRG LVL3 (GOWN DISPOSABLE) ×8
KIT BASIN OR (CUSTOM PROCEDURE TRAY) ×2 IMPLANT
KIT ROOM TURNOVER OR (KITS) ×2 IMPLANT
LIGHT WAVEGUIDE WIDE FLAT (MISCELLANEOUS) ×1 IMPLANT
LIQUID BAND (GAUZE/BANDAGES/DRESSINGS) ×4 IMPLANT
MARKER SKIN DUAL TIP RULER LAB (MISCELLANEOUS) ×2 IMPLANT
NS IRRIG 1000ML POUR BTL (IV SOLUTION) ×4 IMPLANT
PACK GENERAL/GYN (CUSTOM PROCEDURE TRAY) ×2 IMPLANT
PAD ARMBOARD 7.5X6 YLW CONV (MISCELLANEOUS) ×2 IMPLANT
PIN SAFETY STERILE (MISCELLANEOUS) ×2 IMPLANT
SET ASEPTIC TRANSFER (MISCELLANEOUS) ×2 IMPLANT
SOL PREP POV-IOD 4OZ 10% (MISCELLANEOUS) ×2 IMPLANT
SOLUTION BETADINE 4OZ (MISCELLANEOUS) ×3 IMPLANT
SPONGE LAP 18X18 X RAY DECT (DISPOSABLE) ×3 IMPLANT
STAPLER VISISTAT 35W (STAPLE) ×2 IMPLANT
SUT ETHILON 2 0 FS 18 (SUTURE) ×2 IMPLANT
SUT MNCRL 3 0 RB1 (SUTURE) IMPLANT
SUT MNCRL AB 4-0 PS2 18 (SUTURE) ×2 IMPLANT
SUT MON AB 5-0 PS2 18 (SUTURE) IMPLANT
SUT MONOCRYL 3 0 RB1 (SUTURE)
SUT SILK 2 0 FS (SUTURE) ×2 IMPLANT
SUT VIC AB 3-0 SH 27 (SUTURE) ×8
SUT VIC AB 3-0 SH 27X BRD (SUTURE) IMPLANT
SUT VIC AB 4-0 PS2 27 (SUTURE) ×1 IMPLANT
SUT VICRYL 3 0 (SUTURE) IMPLANT
SUT VICRYL 4-0 PS2 18IN ABS (SUTURE) ×1 IMPLANT
SYR BULB IRRIGATION 50ML (SYRINGE) ×2 IMPLANT
TISSUE EXPANDER MX 400CC (Breast) ×2 IMPLANT
TISSUE EXPANDER MX 400CC (Prosthesis & Implant Plastic) ×1 IMPLANT
TOWEL OR 17X24 6PK STRL BLUE (TOWEL DISPOSABLE) ×3 IMPLANT
TOWEL OR 17X26 10 PK STRL BLUE (TOWEL DISPOSABLE) ×2 IMPLANT
TRAY FOLEY CATH 16FRSI W/METER (SET/KITS/TRAYS/PACK) ×1 IMPLANT
TUBE CONNECTING 12X1/4 (SUCTIONS) ×2 IMPLANT

## 2016-02-26 NOTE — Discharge Instructions (Signed)
[facility] °[phone] ° °MASTECTOMY: POST OP INSTRUCTIONS ° °Always review your discharge instruction sheet given to you by the facility where your surgery was performed. °IF YOU HAVE DISABILITY OR FAMILY LEAVE FORMS, YOU MUST BRING THEM TO THE OFFICE FOR PROCESSING.   °DO NOT GIVE THEM TO YOUR DOCTOR. °A prescription for pain medication may be given to you upon discharge.  Take your pain medication as prescribed, if needed.  If narcotic pain medicine is not needed, then you may take acetaminophen (Tylenol), naprosyn (Alleve) or ibuprofen (Advil) as needed. °1. Take your usually prescribed medications unless otherwise directed. °2. If you need a refill on your pain medication, please contact your pharmacy.  They will contact our office to request authorization.  Prescriptions will not be filled after 5pm or on week-ends. °3. You should follow a light diet the first few days after arrival home, such as soup and crackers, etc.  Resume your normal diet the day after surgery. °4. Most patients will experience some swelling and bruising on the chest and underarm.  Ice packs will help.  Swelling and bruising can take several days to resolve. Wear the binder day and night until you return to the office.  °5. It is common to experience some constipation if taking pain medication after surgery.  Increasing fluid intake and taking a stool softener (such as Colace) will usually help or prevent this problem from occurring.  A mild laxative (Milk of Magnesia or Miralax) should be taken according to package instructions if there are no bowel movements after 48 hours. °6. Unless discharge instructions indicate otherwise, leave your bandage dry and in place until your next appointment in 3-5 days.  You may take a limited sponge bath.  No tube baths or showers until the drains are removed.  You may have steri-strips (small skin tapes) in place directly over the incision.  These strips should be left on the  skin for 7-10 days. If you have glue it will come off in next couple week.  Any sutures will be removed at an office visit °7. DRAINS:  If you have drains in place, it is important to keep a list of the amount of drainage produced each day in your drains.  Before leaving the hospital, you should be instructed on drain care.  Call our office if you have any questions about your drains. I will remove your drains when they put out less than 30 cc or ml for 2 consecutive days. °8. ACTIVITIES:  You may resume regular (light) daily activities beginning the next day--such as daily self-care, walking, climbing stairs--gradually increasing activities as tolerated.  You may have sexual intercourse when it is comfortable.  Refrain from any heavy lifting or straining until approved by your doctor. °a. You may drive when you are no longer taking prescription pain medication, you can comfortably wear a seatbelt, and you can safely maneuver your car and apply brakes. °b. RETURN TO WORK:  __________________________________________________________ °9. You should see your doctor in the office for a follow-up appointment approximately 3-5 days after your surgery.  Your doctor’s nurse will typically make your follow-up appointment when she calls you with your pathology report.  Expect your pathology report 3-4business days after surgery. °10. OTHER INSTRUCTIONS: ______________________________________________________________________________________________ ____________________________________________________________________________________________ °WHEN TO CALL YOUR DR Erica Burch: °1. Fever over 101.0 °2. Nausea and/or vomiting °3. Extreme swelling or bruising °4. Continued bleeding from incision. °5. Increased pain, redness, or drainage from the incision. °The clinic staff is available   to answer your questions during regular business hours.  Please don’t hesitate to call and ask to speak to one of the nurses for clinical concerns.  If  you have a medical emergency, go to the nearest emergency room or call 911.  A surgeon from [facility] is always on call at the hospital. °[address], [address], [place], [place]  [address] ? [address] [address], [place], [place]   [address] °[phone] ? [phone] ? FAX [phone] °Web site: [email or web address] ° °

## 2016-02-26 NOTE — Anesthesia Preprocedure Evaluation (Addendum)
Anesthesia Evaluation  Patient identified by MRN, date of birth, ID band Patient awake    Reviewed: Allergy & Precautions, NPO status , Patient's Chart, lab work & pertinent test results  Airway Mallampati: II   Neck ROM: full    Dental  (+) Teeth Intact, Dental Advisory Given   Pulmonary former smoker,    breath sounds clear to auscultation       Cardiovascular  Rhythm:regular Rate:Normal     Neuro/Psych PSYCHIATRIC DISORDERS Anxiety    GI/Hepatic   Endo/Other    Renal/GU stones     Musculoskeletal   Abdominal   Peds  Hematology   Anesthesia Other Findings   Reproductive/Obstetrics                            Anesthesia Physical Anesthesia Plan  ASA: II  Anesthesia Plan: General and Regional   Post-op Pain Management:  Regional for Post-op pain   Induction: Intravenous  Airway Management Planned: Oral ETT  Additional Equipment:   Intra-op Plan:   Post-operative Plan: Extubation in OR  Informed Consent: I have reviewed the patients History and Physical, chart, labs and discussed the procedure including the risks, benefits and alternatives for the proposed anesthesia with the patient or authorized representative who has indicated his/her understanding and acceptance.     Plan Discussed with: CRNA, Anesthesiologist and Surgeon  Anesthesia Plan Comments:         Anesthesia Quick Evaluation

## 2016-02-26 NOTE — Op Note (Signed)
Operative Note   DATE OF OPERATION: 5.30.2017  LOCATION: [facility] OR- observation  SURGICAL DIVISION: Plastic Surgery  PREOPERATIVE DIAGNOSES:  BRCA1 carrier  POSTOPERATIVE DIAGNOSES:  same  PROCEDURE:  1. Bilateral breast reconstruction with tissue expanders 2. Acellular dermis (Cortiva) for breast reconstruction 90 cm2  SURGEON: Irene Limbo MD MBA  ASSISTANT: none  ANESTHESIA:  General.   EBL: 450 ml for entire case  COMPLICATIONS: None immediate.   INDICATIONS FOR PROCEDURE:  The patient, Erica Burch, is a 45 y.o. female born on 07/15/1971, is here for immediate expander based reconstruction following bilateral prophylactic nipple sparing mastectomies.   FINDINGS: .Right mastectomy 469 g Left mastectomy 403 g. Natrelle 133-MX-12 T 400 ml tissue expanders placed bilateral, initial fill volume 180 ml bilateral. RIGHT SN [id number] LEFT SN [id number]  DESCRIPTION OF PROCEDURE:  The patient was marked with the patient in the preoperative area to mark sternal notch, chest midline, anterior axillary lines and inframammary folds. The patient was taken to the operating room. SCDs were placed and IV antibiotics were given. The patient's operative site was prepped and draped in a sterile fashion. A time out was performed and all information was confirmed to be correct. Following completion of mastectomies, reconstruction began on right side. The inferior insertions of pectoralis major muscle were elevated continuous with abdominal wall fascia. Submuscular dissection completed toward clavicle. The anterior rectus fascia was elevated 1-2 cm below inframammary fold. Cortiva was perforated and sewn to inferior border of pectoralis major with running 3-0 vicryl. A 19 Fr drain was placed in subcutaneous position laterally and submuscular position medialy and secured to skin with 2-0 nylon. The cavity was irrigated with solution containing Ancef, genatmicin, and bacitracin. Hemostasis was  ensured. Cavity irrigated with Betadine. The tissue expander was prepared and placed in submuscular position. The expander was secured to chest wall with a 3-0 vicryl. The inferior border of the acellular dermis was inset to elevated abdominal wall fascia inferiorly and laterally was secured to serratus fascia. The incision was closed with 3-0 vicryl in fascial layer and 4-0 vicryl in dermis. Skin closure completed with 4-0 monocryl subcuticular and tissue adhesive. Over left breast, similarly the inferior pectoralis insertions were divided and anterior rectus fascia elevated for below inframammary fold. The acellular dermis was prepared and secured to inferior border of pectoralis with 3-0 vicryl. 19 Fr drain placed in cavity prior to placement of expander. The inferior and lateral borders of acellular dermis secured with abdominal wall fascia and chest wall. Closure completed in similar fashion. The ports were accessed and filled to 180 ml bilaterally. The patient was brought to upright position and the skin flaps were redraped so that NAC was symmetric from the sternal notch and midline. Transparent, adherent dressings applied. Dry dressing and breast binder applied.  The patient was allowed to wake from anesthesia, extubated and taken to the recovery room in satisfactory condition.   SPECIMENS: none  DRAINS: 19 Fr JP in right and left breast reconstruction  Irene Limbo, MD [facility] Plastic & Reconstructive Surgery [phone], pin [phone]

## 2016-02-26 NOTE — Anesthesia Postprocedure Evaluation (Signed)
Anesthesia Post Note  Patient: Erica Burch  Procedure(s) Performed: Procedure(s) (LRB): BILATERAL PROPHYLACTIC NIPPLE SPARING MASTECTOMIES (Bilateral) BILATERAL BREAST RECONSTRUCTION WITH PLACEMENT OF TISSUE EXPANDER AND POSSIBLE ACELLULAR HYDRATED DERMIS (Bilateral)  Patient location during evaluation: PACU Anesthesia Type: General Level of consciousness: sedated Pain management: pain level controlled Vital Signs Assessment: post-procedure vital signs reviewed and stable Respiratory status: spontaneous breathing and respiratory function stable Cardiovascular status: stable Anesthetic complications: no    Last Vitals:  Filed Vitals:   02/26/16 1700 02/26/16 1723  BP: 96/57 99/69  Pulse: 57 62  Temp: 36.6 C 36.6 C  Resp: 15 16    Last Pain:  Filed Vitals:   02/26/16 1724  PainSc: 2                  Marwa Fuhrman DANIEL

## 2016-02-26 NOTE — Transfer of Care (Signed)
Immediate Anesthesia Transfer of Care Note  Patient: Erica Burch  Procedure(s) Performed: Procedure(s): BILATERAL PROPHYLACTIC NIPPLE SPARING MASTECTOMIES (Bilateral) BILATERAL BREAST RECONSTRUCTION WITH PLACEMENT OF TISSUE EXPANDER AND POSSIBLE ACELLULAR HYDRATED DERMIS (Bilateral)  Patient Location: PACU  Anesthesia Type:General  Level of Consciousness: awake, alert  and oriented  Airway & Oxygen Therapy: Patient Spontanous Breathing and Patient connected to nasal cannula oxygen  Post-op Assessment: Report given to RN and Post -op Vital signs reviewed and stable  Post vital signs: Reviewed and stable  Last Vitals:  Filed Vitals:   02/26/16 1210 02/26/16 1213  BP:  142/78  Pulse: 87 87  Temp:    Resp: 19 13    Last Pain: There were no vitals filed for this visit.    Patients Stated Pain Goal: 2 ([id number])  Complications: No apparent anesthesia complications

## 2016-02-26 NOTE — Interval H&P Note (Signed)
History and Physical Interval Note:  02/26/2016 11:50 AM  Erica Burch  has presented today for surgery, with the diagnosis of BRCA 1 MUTATION  The various methods of treatment have been discussed with the patient and family. After consideration of risks, benefits and other options for treatment, the patient has consented to  Procedure(s): BILATERAL PROPHYLACTIC NIPPLE SPARING MASTECTOMIES (Bilateral) BILATERAL BREAST RECONSTRUCTION WITH PLACEMENT OF TISSUE EXPANDER AND POSSIBLE ACELLULAR HYDRATED DERMIS (Bilateral) as a surgical intervention .  The patient's history has been reviewed, patient examined, no change in status, stable for surgery.  I have reviewed the patient's chart and labs.  Questions were answered to the patient's satisfaction.     Mico Spark

## 2016-02-26 NOTE — Anesthesia Procedure Notes (Addendum)
Procedure Name: Intubation Date/Time: 02/26/2016 12:50 PM Performed by: Merdis Delay Pre-anesthesia Checklist: Patient identified, Timeout performed, Emergency Drugs available, Suction available and Patient being monitored Patient Re-evaluated:Patient Re-evaluated prior to inductionOxygen Delivery Method: Circle system utilized Preoxygenation: Pre-oxygenation with 100% oxygen Intubation Type: IV induction Ventilation: Mask ventilation without difficulty Laryngoscope Size: Mac and 3 Grade View: Grade II Tube type: Oral Tube size: 7.5 mm Number of attempts: 1 Placement Confirmation: ETT inserted through vocal cords under direct vision,  breath sounds checked- equal and bilateral,  positive ETCO2 and CO2 detector Secured at: 22 cm Tube secured with: Tape Dental Injury: Teeth and Oropharynx as per pre-operative assessment    Anesthesia Regional Block:  Pectoralis block  Pre-Anesthetic Checklist: ,, timeout performed, Correct Patient, Correct Site, Correct Laterality, Correct Procedure, Correct Position, site marked, Risks and benefits discussed,  Surgical consent,  Pre-op evaluation,  At surgeon's request and post-op pain management  Laterality: Left and Right  Prep: chloraprep       Needles:  Injection technique: Single-shot  Needle Type: Echogenic Needle     Needle Length: 9cm 9 cm Needle Gauge: 21 and 21 G    Additional Needles:  Procedures: ultrasound guided (picture in chart) Pectoralis block Narrative:  Start time: 02/26/2016 11:30 AM End time: 02/26/2016 11:43 AM Injection made incrementally with aspirations every 5 mL.  Performed by: Personally  Anesthesiologist: Ahleah Simko  Additional Notes: Left and right PECS blocks were performed.  20 mL 0.5% bupivacaine +epi was injected on each side.  Ultrasound guidance was used.  Sterile technique.  Pt tolerated the procedure well.

## 2016-02-26 NOTE — Op Note (Signed)
   Preoperative diagnosis: BRCA positive Postoperative diagnosis: same as above Procedure: bilateral nipple sparing prophylactic nipple sparing mastectomies Surgeon: Dr Serita Grammes Asst: Dr Irene Limbo Anesthesia: general with bilateral pectoral block EBL: minimal Drains per plastic surgery Specimens: right and left breast marked short superior long lateral and double deep, right and left nipples Complications none Sponge and needle count correct dispo case turned over to plastics  Indications: There is a 89 yof who presents with brca mutation. She has no evidence of cancer on screening. We discussed bilateral nsm with reconstruction  Procedure: After informed consent was obtained the patient was taken to the OR.  She was given antibiotics and scds were in place.  She had pec blocks.  She was placed under general anesthesia without complication. She was prepped and draped in the standard sterile surgical fashion.  A timeout was performed.  I made a 10 cm inframammary incision about 8 cm from sternum on the right side initially. I then created a posterior flap with cautery removing the fascia from the pectoralis muscle. This was done to the parasternal region, clavicle and latissimus laterally. I then created the anterior flap. The breast was then removed in its entirety. The skin and nac were all viable. The breast was marked as above. I removed the nipple margin as a separate specimen. Hemostasis was obtained. I then performed the left mastectomy in a similar fashion.  Case was turned over to plastic surgery for completion.

## 2016-02-26 NOTE — Interval H&P Note (Signed)
History and Physical Interval Note:  02/26/2016 10:17 AM  Erica Burch  has presented today for surgery, with the diagnosis of BRCA 1 MUTATION  The various methods of treatment have been discussed with the patient and family. After consideration of risks, benefits and other options for treatment, the patient has consented to  Procedure(s): BILATERAL PROPHYLACTIC NIPPLE SPARING MASTECTOMIES (Bilateral) BILATERAL BREAST RECONSTRUCTION WITH PLACEMENT OF TISSUE EXPANDER AND POSSIBLE ACELLULAR HYDRATED DERMIS (Bilateral) as a surgical intervention .  The patient's history has been reviewed, patient examined, no change in status, stable for surgery.  I have reviewed the patient's chart and labs.  Questions were answered to the patient's satisfaction.     [name]

## 2016-02-26 NOTE — H&P (Signed)
45 yo healthy female who has significant family history of breast cancer including mom x2 first in her 43s who was tested in late 42s at [facility] for brca. she has a brca one mutation. she has undergone in 2012 a tah/bso. she has seen someone before about proph mastectomies but was not ready to do this. she does not have any breast complaints at this time. her last mm on 12/03/15 showed possible right breast mass. density is c. spot views show circumscribed 0.9 cm mass at 12 oclock right breast. US shows an oval mass measuring 8x4x7 mm. [place] of right axilla is negative. 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 she had mri several years ago. she is here today with her mom to discuss possible prophylactic mastectomy. she has recently seen Dr Iran Planas of plastic surgery also.  Other Problems  Cholelithiasis Kidney Stone  Past Surgical History Breast Biopsy Bilateral. Gallbladder Surgery - Laparoscopic Hysterectomy (not due to cancer) - Complete Oral Surgery Tonsillectomy  Diagnostic Studies History  Colonoscopy never Mammogram within last year  Allergies  No Known Drug Allergies  Medication History  No Current Medications Medications Reconciled  Social History Caffeine use Carbonated beverages. No alcohol use No drug use Tobacco use Former smoker.  Family History  Breast Cancer Mother. Diabetes Mellitus Father. Hypertension Father. Migraine Headache Mother.  Pregnancy / Birth History  Age at menarche 74 years. Age of menopause <46 Gravida 3 Maternal age 67-20 Para 3   Review of Systems  Skin Not Present- Change in Wart/Mole, Dryness, Hives, Jaundice, New Lesions, Non-Healing Wounds, Rash and Ulcer. HEENT Present- Wears glasses/contact lenses. Not Present- Earache, Hearing Loss, Hoarseness, Nose Bleed, Oral Ulcers, Ringing in the Ears, Seasonal Allergies, Sinus Pain, Sore Throat, Visual Disturbances and Yellow Eyes. Respiratory Not  Present- Bloody sputum, Chronic Cough, Difficulty Breathing, Snoring and Wheezing. Breast Not Present- Breast Mass, Breast Pain, Nipple Discharge and Skin Changes. Cardiovascular Not Present- Chest Pain, Difficulty Breathing Lying Down, Leg Cramps, Palpitations, Rapid Heart Rate, Shortness of Breath and Swelling of Extremities. Gastrointestinal Not Present- Abdominal Pain, Bloating, Bloody Stool, Change in Bowel Habits, Chronic diarrhea, Constipation, Difficulty Swallowing, Excessive gas, Gets full quickly at meals, Hemorrhoids, Indigestion, Nausea, Rectal Pain and Vomiting. Female Genitourinary Not Present- Frequency, Nocturia, Painful Urination, Pelvic Pain and Urgency. Musculoskeletal Not Present- Back Pain, Joint Pain, Joint Stiffness, Muscle Pain, Muscle Weakness and Swelling of Extremities. Neurological Not Present- Decreased Memory, Fainting, Headaches, Numbness, Seizures, Tingling, Tremor, Trouble walking and Weakness. Psychiatric Not Present- Anxiety, Bipolar, Change in Sleep Pattern, Depression, Fearful and Frequent crying. Endocrine Present- Hot flashes. Not Present- Cold Intolerance, Excessive Hunger, Hair Changes, Heat Intolerance and New Diabetes. Hematology Not Present- Easy Bruising, Excessive bleeding, Gland problems, HIV and Persistent Infections.  Vitals  Weight: 159 lb Height: 64in Body Surface Area: 1.77 m Body Mass Index: 27.29 kg/m  Pulse: 78 (Regular)  BP: 142/82 (Sitting, Left Arm, Standard)  Physical Exam  General Mental Status-Alert. Orientation-Oriented X3. Chest and Lung Exam Chest and lung exam reveals -on auscultation, normal breath sounds, no adventitious sounds and normal vocal resonance. Breast Nipples-No Discharge. Breast Lump-No Palpable Breast Mass. Cardiovascular Cardiovascular examination reveals -normal heart sounds, regular rate and rhythm with no murmurs. Lymphatic Head & Neck General Head & Neck Lymphatics: Bilateral -  Description - Normal. Axillary General Axillary Region: Bilateral - Description - Normal. Note: no [place] adenopathy  Assessment & Plan  BRCA1 POSITIVE (Z15.01) Story: MRI breast, bilateral prophylactic nipple sparing mastectomies we discussed her risk of  breast cancer moving forward and all options. Options include close follow up with mri/mm/exams annually. this would not obviously require surgery but would not prevent breast cancer with goal of identifying early. other option is risk reduction surgery. I do think prior to this we should obtain an mri just to make sure there is not mammographically occult cancer present which would change any surgical plans. we discussed nipple sparing mastectomies which I think she is a great candidate for. would get immediate reconstruction with Dr Iran Planas with expanders. we discussed incision and recovery. risks specific to nsm include nipple margin having cancer on path requiring excision, nipple loss, partial or full thickness skin loss, asensate nipple. she understands and would like to proceed

## 2016-02-27 DIAGNOSIS — Z79899 Other long term (current) drug therapy: Secondary | ICD-10-CM | POA: Diagnosis not present

## 2016-02-27 DIAGNOSIS — Z4001 Encounter for prophylactic removal of breast: Secondary | ICD-10-CM | POA: Diagnosis not present

## 2016-02-27 DIAGNOSIS — Z803 Family history of malignant neoplasm of breast: Secondary | ICD-10-CM | POA: Diagnosis not present

## 2016-02-27 DIAGNOSIS — Z87891 Personal history of nicotine dependence: Secondary | ICD-10-CM | POA: Diagnosis not present

## 2016-02-27 DIAGNOSIS — F419 Anxiety disorder, unspecified: Secondary | ICD-10-CM | POA: Diagnosis not present

## 2016-02-27 DIAGNOSIS — Z1501 Genetic susceptibility to malignant neoplasm of breast: Secondary | ICD-10-CM | POA: Diagnosis not present

## 2016-02-27 MED ORDER — OXYCODONE HCL 5 MG PO TABS: 5.0000 mg | ORAL_TABLET | ORAL | Status: DC | PRN

## 2016-02-27 MED ORDER — METHOCARBAMOL 500 MG PO TABS: 500.0000 mg | ORAL_TABLET | Freq: Three times a day (TID) | ORAL | Status: DC | PRN

## 2016-02-27 MED ORDER — SULFAMETHOXAZOLE-TRIMETHOPRIM 800-160 MG PO TABS: 1.0000 | ORAL_TABLET | Freq: Two times a day (BID) | ORAL | Status: DC

## 2016-02-27 NOTE — Discharge Summary (Signed)
Physician Discharge Summary  Patient ID: Erica Burch MRN: [id number] DOB/AGE: 44/22/1972 43 y.o.  Admit date: 02/26/2016 Discharge date: 02/27/2016  Admission Diagnoses: BRCA positive  Discharge Diagnoses:  Active Problems:   BRCA positive   Discharged Condition: stable  Hospital Course: Post operatively tolerated diet and oral pain medication. Continued on IV antibiotics for 24 hours and oral antibiotics for home for week. Instructed on wound care and drains. Able to ambulate with minimal assist prior to discharge.  Treatments: surgery: bilateral nipple sparing mastectomies, Tissue expander and acellular dermis reconstruction 5.30.17  Discharge Exam: Blood pressure 97/53, pulse 67, temperature 98.9 F (37.2 C), temperature source Oral, resp. rate 16, height 5' 4" (1.626 m), weight 71.66 kg (157 lb 15.7 oz), last menstrual period 04/28/2009, SpO2 95 %. Incision/Wound: chest soft, flat, expected ecchymoses, drains watery  Disposition: 01-Home or Self Care  Discharge Instructions    Call MD for:  redness, tenderness, or signs of infection (pain, swelling, bleeding, redness, odor or green/yellow discharge around incision site)    Complete by:  As directed      Call MD for:  temperature >100.5    Complete by:  As directed      Discharge instructions    Complete by:  As directed   Ok to remove dressings and shower am 6.2.17. Soap and water ok, pat tegaderms dry. No creams or ointments over incisions. Do not let drains dangle in shower, attach to lanyard or similar.Strip and record drains twice daily and bring log to clinic visit.  Breast binder or soft compression bra, abdominal binder or compression garment all other times.  Ok to raise arms above shoulders for bathing and dressing.  No house yard work or exercise until cleared by MD.     Driving Restrictions    Complete by:  As directed   No driving while taking narcotics     Lifting restrictions    Complete by:  As  directed   No lifting greater than 5 lbs     Resume previous diet    Complete by:  As directed             Medication List    TAKE these medications        ALPRAZolam 0.5 MG tablet  Commonly known as:  XANAX  Take 0.5 mg by mouth 2 (two) times daily as needed for anxiety.     calcium citrate-vitamin D 315-200 MG-UNIT tablet  Commonly known as:  CITRACAL+D  Take 1 tablet by mouth 2 (two) times daily.     methocarbamol 500 MG tablet  Commonly known as:  ROBAXIN  Take 1 tablet (500 mg total) by mouth every 8 (eight) hours as needed for muscle spasms.     oxyCODONE 5 MG immediate release tablet  Commonly known as:  Oxy IR/ROXICODONE  Take 1-2 tablets (5-10 mg total) by mouth every 4 (four) hours as needed for moderate pain.     sulfamethoxazole-trimethoprim 800-160 MG tablet  Commonly known as:  BACTRIM DS,SEPTRA DS  Take 1 tablet by mouth 2 (two) times daily.           Follow-up Information    Follow up with WAKEFIELD,MATTHEW, MD In 2 weeks.   Specialty:  General Surgery   Contact information:   [address] [place] [address] [phone]       Follow up with THIMMAPPA, BRINDA, MD In 1 week.   Specialty:  Plastic Surgery   Why:  as   scheduled   Contact information:   [address] [place] [address] [phone]       Signed: Irene Limbo 02/27/2016, 1:50 PM

## 2016-02-27 NOTE — Progress Notes (Signed)
Patient ID: Erica Burch, female   DOB: September 16, 1971, 45 y.o.   MRN: [id number] Doing well pod 1 bilateral nsm Dc later today

## 2016-02-27 NOTE — Progress Notes (Signed)
Pt discharged to home.  Discharge instructions explained to pt.  Pt has no questions at the time of discharge.  IV removed.  Pt states she has all belongings.  Pt taken off unit in wheelchair by volunteer services.

## 2016-02-27 NOTE — Progress Notes (Signed)
  POD #1 bilateral NSM. TE/ADM recon  OOB to BR, tolerating diet  Temp:  [97.8 F (36.6 C)-99.3 F (37.4 C)] 98.9 F (37.2 C) (05/31 0612) Pulse Rate:  [55-96] 67 (05/31 0612) Resp:  [12-26] 16 (05/30 1723) BP: (96-151)/(53-100) 97/53 mmHg (05/31 0612) SpO2:  [78 %-100 %] 95 % (05/31 0612) Weight:  [71.66 kg (157 lb 15.7 oz)-71.668 kg (158 lb)] 71.66 kg (157 lb 15.7 oz) (05/30 1723)   JP 120/135 PO 1480  PE Alert Chest flat, developing ecchymoses, flaps viable   A/P Possible home this pm if tolerates pain medication and ambulates  Irene Limbo, MD [facility] Plastic & Reconstructive Surgery [phone], pin [phone]

## 2016-02-28 ENCOUNTER — Encounter (HOSPITAL_COMMUNITY): Payer: Self-pay | Admitting: General Surgery

## 2016-03-02 NOTE — Addendum Note (Signed)
Addendum  created 03/02/16 0818 by Albertha Ghee, MD   Modules edited: Anesthesia Blocks and Procedures, Clinical Notes   Clinical Notes:  File: [id number]

## 2016-03-07 NOTE — Addendum Note (Signed)
Addendum  created 03/07/16 1800 by Duane Boston, MD   Modules edited: Anesthesia Responsible Staff

## 2016-03-19 DIAGNOSIS — L03313 Cellulitis of chest wall: Secondary | ICD-10-CM | POA: Diagnosis not present

## 2016-03-19 DIAGNOSIS — Z1502 Genetic susceptibility to malignant neoplasm of ovary: Secondary | ICD-10-CM | POA: Diagnosis not present

## 2016-03-19 DIAGNOSIS — Z1501 Genetic susceptibility to malignant neoplasm of breast: Secondary | ICD-10-CM | POA: Diagnosis not present

## 2016-03-19 DIAGNOSIS — Z9013 Acquired absence of bilateral breasts and nipples: Secondary | ICD-10-CM | POA: Diagnosis not present

## 2016-03-24 ENCOUNTER — Encounter (HOSPITAL_BASED_OUTPATIENT_CLINIC_OR_DEPARTMENT_OTHER): Payer: Self-pay | Admitting: *Deleted

## 2016-03-25 ENCOUNTER — Encounter (HOSPITAL_BASED_OUTPATIENT_CLINIC_OR_DEPARTMENT_OTHER): Payer: Self-pay | Admitting: Anesthesiology

## 2016-03-25 ENCOUNTER — Ambulatory Visit (HOSPITAL_BASED_OUTPATIENT_CLINIC_OR_DEPARTMENT_OTHER)
Admission: RE | Admit: 2016-03-25 | Payer: BLUE CROSS/BLUE SHIELD | Source: Ambulatory Visit | Admitting: Plastic Surgery

## 2016-03-25 SURGERY — REMOVAL, TISSUE EXPANDER, BREAST, WITH IMPLANT INSERTION
Anesthesia: General | Laterality: Left

## 2016-05-20 NOTE — H&P (Signed)
  Subjective:    Patient ID: Erica Burch is a 45 y.o. female.  HPI  3 months post op. Course complicated by seroma left chest with cellulitis, culture Staph lugdensis, pan sensitive. Completed Bactrim course. Notes wearing a C cup bra presently and not filling out.  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 Mother underwent implant based reconstruction with latissimus on right and direct to implant on left, no history expanders for her. Final pathology benign.  Patient last MMG 11/2015, had biopsy on right and benign. One prior lumpectomy on left at [facility], benign. Screening MRI negative.  Prior 53 D, desires smaller. Right mastectomy 469 g Left 403 g   Review of Systems    Objective:   Physical Exam  CV: normal heart sounds PULM: clear to auscultation ABD: soft no hernias Chest:   both appear soft, no erythema, right expander has displaced laterally and inferiorly and depression contour noted superior pole BW R 13 L 13  Assessment:     BRCA1 S/p bilateral NSM, TE/ADM reconstruction  Plan:     Plan implant exchange and fat grafting to chest. Accompanied by husband today and we reviewed abdominal donor site fat, need for compression garment for several weeks, no drains here, variable take fat graft and may need to repeat. Reviewed goal of fat grafting to thicken flaps, improve contour depression and try to mask some of animation - though latter can always be present with submuscular placement.  Patient desires round implant. Reviewed saline vs silicone, textured vs smooth. Reviewed purpose of texturing can aid with preventing displacement (as she has experienced with expander), decrease capsular contracture. Risks of texturing include ALCL and provided ASPS FAQs handout on this, counseled if she elects for texturing would plan  Mentor device as incidence ALCL significantly lower with Siltex textured devices. Reviewed MRI surveillance of silicone. All implants have risk of rippling, rupture, capsular contracture, infection requiring removal, displacement.   She has elected for smooth round silicone.  Natrelle 133-MX-12 T 400 ml tissue expanders placed bilateral,  RIGHT 415 ml total fill volume.  LEFT 415 ml total fill volume.   Irene Limbo, MD [facility] Plastic & Reconstructive Surgery [phone], pin [phone]

## 2016-06-04 ENCOUNTER — Encounter (HOSPITAL_BASED_OUTPATIENT_CLINIC_OR_DEPARTMENT_OTHER): Payer: Self-pay | Admitting: *Deleted

## 2016-06-10 ENCOUNTER — Ambulatory Visit (HOSPITAL_BASED_OUTPATIENT_CLINIC_OR_DEPARTMENT_OTHER): Payer: BLUE CROSS/BLUE SHIELD | Admitting: Anesthesiology

## 2016-06-10 ENCOUNTER — Ambulatory Visit (HOSPITAL_BASED_OUTPATIENT_CLINIC_OR_DEPARTMENT_OTHER)
Admission: RE | Admit: 2016-06-10 | Discharge: 2016-06-10 | Disposition: A | Discharge status: Home / Self Care | Payer: BLUE CROSS/BLUE SHIELD | Source: Ambulatory Visit | Attending: Plastic Surgery | Admitting: Plastic Surgery

## 2016-06-10 ENCOUNTER — Encounter (HOSPITAL_BASED_OUTPATIENT_CLINIC_OR_DEPARTMENT_OTHER): Payer: Self-pay

## 2016-06-10 ENCOUNTER — Encounter (HOSPITAL_BASED_OUTPATIENT_CLINIC_OR_DEPARTMENT_OTHER)
Admission: RE | Disposition: A | Discharge status: Home / Self Care | Payer: Self-pay | Source: Ambulatory Visit | Attending: Plastic Surgery

## 2016-06-10 DIAGNOSIS — Z421 Encounter for breast reconstruction following mastectomy: Secondary | ICD-10-CM | POA: Insufficient documentation

## 2016-06-10 DIAGNOSIS — I739 Peripheral vascular disease, unspecified: Secondary | ICD-10-CM | POA: Diagnosis not present

## 2016-06-10 DIAGNOSIS — Z9013 Acquired absence of bilateral breasts and nipples: Secondary | ICD-10-CM | POA: Diagnosis not present

## 2016-06-10 DIAGNOSIS — Z1501 Genetic susceptibility to malignant neoplasm of breast: Secondary | ICD-10-CM | POA: Diagnosis not present

## 2016-06-10 DIAGNOSIS — Z87891 Personal history of nicotine dependence: Secondary | ICD-10-CM | POA: Diagnosis not present

## 2016-06-10 DIAGNOSIS — F419 Anxiety disorder, unspecified: Secondary | ICD-10-CM | POA: Diagnosis not present

## 2016-06-10 DIAGNOSIS — Z803 Family history of malignant neoplasm of breast: Secondary | ICD-10-CM | POA: Diagnosis not present

## 2016-06-10 HISTORY — PX: LIPOSUCTION WITH LIPOFILLING: SHX6436

## 2016-06-10 HISTORY — PX: REMOVAL OF TISSUE EXPANDER AND PLACEMENT OF IMPLANT: SHX6457

## 2016-06-10 SURGERY — REMOVAL, TISSUE EXPANDER, BREAST, WITH IMPLANT INSERTION
Anesthesia: General | Site: Chest | Laterality: Bilateral

## 2016-06-10 MED ORDER — LACTATED RINGERS IV SOLN
INTRAVENOUS | Status: DC
  Administered 2016-06-10 (×3): via INTRAVENOUS

## 2016-06-10 MED ORDER — CHLORHEXIDINE GLUCONATE CLOTH 2 % EX PADS: 6.0000 | MEDICATED_PAD | Freq: Once | CUTANEOUS | Status: DC

## 2016-06-10 MED ORDER — ONDANSETRON HCL 4 MG/2ML IJ SOLN
INTRAMUSCULAR | Status: DC | PRN
  Administered 2016-06-10: 4 mg via INTRAVENOUS

## 2016-06-10 MED ORDER — MEPERIDINE HCL 25 MG/ML IJ SOLN: 6.2500 mg | INTRAMUSCULAR | Status: DC | PRN

## 2016-06-10 MED ORDER — METOPROLOL TARTRATE 5 MG/5ML IV SOLN
INTRAVENOUS | Status: AC
  Filled 2016-06-10: qty 5

## 2016-06-10 MED ORDER — SODIUM CHLORIDE 0.9 % IV SOLN
INTRAVENOUS | Status: DC | PRN
  Administered 2016-06-10: 1000 mL

## 2016-06-10 MED ORDER — SODIUM BICARBONATE 4 % IV SOLN
INTRAVENOUS | Status: AC
  Filled 2016-06-10: qty 10

## 2016-06-10 MED ORDER — SODIUM BICARBONATE 4 % IV SOLN
INTRAVENOUS | Status: DC | PRN
  Administered 2016-06-10: 400 mL via INTRAMUSCULAR

## 2016-06-10 MED ORDER — PROPOFOL 10 MG/ML IV BOLUS
INTRAVENOUS | Status: AC
  Filled 2016-06-10: qty 20

## 2016-06-10 MED ORDER — PROPOFOL 500 MG/50ML IV EMUL
INTRAVENOUS | Status: AC
  Filled 2016-06-10: qty 50

## 2016-06-10 MED ORDER — EPHEDRINE SULFATE 50 MG/ML IJ SOLN
INTRAMUSCULAR | Status: DC | PRN
  Administered 2016-06-10 (×2): 10 mg via INTRAVENOUS

## 2016-06-10 MED ORDER — GLYCOPYRROLATE 0.2 MG/ML IJ SOLN: 0.2000 mg | Freq: Once | INTRAMUSCULAR | Status: DC | PRN

## 2016-06-10 MED ORDER — LACTATED RINGERS IV SOLN: INTRAVENOUS | Status: DC

## 2016-06-10 MED ORDER — PROMETHAZINE HCL 25 MG/ML IJ SOLN: 6.2500 mg | INTRAMUSCULAR | Status: DC | PRN

## 2016-06-10 MED ORDER — ARTIFICIAL TEARS OP OINT
TOPICAL_OINTMENT | OPHTHALMIC | Status: AC
  Filled 2016-06-10: qty 3.5

## 2016-06-10 MED ORDER — CELECOXIB 400 MG PO CAPS
400.0000 mg | ORAL_CAPSULE | ORAL | Status: AC
  Administered 2016-06-10: 400 mg via ORAL

## 2016-06-10 MED ORDER — PROPOFOL 10 MG/ML IV BOLUS
INTRAVENOUS | Status: DC | PRN
  Administered 2016-06-10: 30 mg via INTRAVENOUS

## 2016-06-10 MED ORDER — HYDROMORPHONE HCL 1 MG/ML IJ SOLN
0.2500 mg | INTRAMUSCULAR | Status: DC | PRN
  Administered 2016-06-10 (×2): 0.5 mg via INTRAVENOUS

## 2016-06-10 MED ORDER — HYDROMORPHONE HCL 1 MG/ML IJ SOLN
INTRAMUSCULAR | Status: AC
  Filled 2016-06-10: qty 1

## 2016-06-10 MED ORDER — FENTANYL CITRATE (PF) 100 MCG/2ML IJ SOLN
INTRAMUSCULAR | Status: AC
  Filled 2016-06-10: qty 2

## 2016-06-10 MED ORDER — OXYCODONE HCL 5 MG/5ML PO SOLN: 5.0000 mg | Freq: Once | ORAL | Status: AC | PRN

## 2016-06-10 MED ORDER — MIDAZOLAM HCL 5 MG/5ML IJ SOLN
INTRAMUSCULAR | Status: DC | PRN
  Administered 2016-06-10: 2 mg via INTRAVENOUS

## 2016-06-10 MED ORDER — MIDAZOLAM HCL 2 MG/2ML IJ SOLN
INTRAMUSCULAR | Status: AC
  Filled 2016-06-10: qty 2

## 2016-06-10 MED ORDER — EPINEPHRINE HCL 1 MG/ML IJ SOLN
INTRAMUSCULAR | Status: AC
  Filled 2016-06-10: qty 1

## 2016-06-10 MED ORDER — SULFAMETHOXAZOLE-TRIMETHOPRIM 800-160 MG PO TABS: 1.0000 | ORAL_TABLET | Freq: Two times a day (BID) | ORAL | 0 refills | Status: DC

## 2016-06-10 MED ORDER — SUCCINYLCHOLINE CHLORIDE 20 MG/ML IJ SOLN
INTRAMUSCULAR | Status: DC | PRN
  Administered 2016-06-10: 50 mg via INTRAVENOUS

## 2016-06-10 MED ORDER — LIDOCAINE HCL (PF) 1 % IJ SOLN
INTRAMUSCULAR | Status: AC
  Filled 2016-06-10: qty 60

## 2016-06-10 MED ORDER — GABAPENTIN 300 MG PO CAPS
300.0000 mg | ORAL_CAPSULE | ORAL | Status: AC
  Administered 2016-06-10: 300 mg via ORAL

## 2016-06-10 MED ORDER — DEXAMETHASONE SODIUM PHOSPHATE 10 MG/ML IJ SOLN
INTRAMUSCULAR | Status: AC
  Filled 2016-06-10: qty 1

## 2016-06-10 MED ORDER — CEFAZOLIN SODIUM-DEXTROSE 2-4 GM/100ML-% IV SOLN
INTRAVENOUS | Status: AC
  Filled 2016-06-10: qty 100

## 2016-06-10 MED ORDER — OXYCODONE HCL 5 MG PO TABS
ORAL_TABLET | ORAL | Status: AC
  Filled 2016-06-10: qty 1

## 2016-06-10 MED ORDER — GABAPENTIN 300 MG PO CAPS
ORAL_CAPSULE | ORAL | Status: AC
  Filled 2016-06-10: qty 1

## 2016-06-10 MED ORDER — SCOPOLAMINE 1 MG/3DAYS TD PT72: 1.0000 | MEDICATED_PATCH | Freq: Once | TRANSDERMAL | Status: DC | PRN

## 2016-06-10 MED ORDER — SUCCINYLCHOLINE CHLORIDE 200 MG/10ML IV SOSY
PREFILLED_SYRINGE | INTRAVENOUS | Status: AC
  Filled 2016-06-10: qty 10

## 2016-06-10 MED ORDER — OXYCODONE HCL 5 MG PO TABS
5.0000 mg | ORAL_TABLET | Freq: Once | ORAL | Status: AC | PRN
  Administered 2016-06-10: 5 mg via ORAL

## 2016-06-10 MED ORDER — FENTANYL CITRATE (PF) 100 MCG/2ML IJ SOLN
INTRAMUSCULAR | Status: DC | PRN
  Administered 2016-06-10: 100 ug via INTRAVENOUS

## 2016-06-10 MED ORDER — ACETAMINOPHEN 500 MG PO TABS
ORAL_TABLET | ORAL | Status: AC
  Filled 2016-06-10: qty 2

## 2016-06-10 MED ORDER — FENTANYL CITRATE (PF) 100 MCG/2ML IJ SOLN: 50.0000 ug | INTRAMUSCULAR | Status: DC | PRN

## 2016-06-10 MED ORDER — MIDAZOLAM HCL 2 MG/2ML IJ SOLN: 1.0000 mg | INTRAMUSCULAR | Status: DC | PRN

## 2016-06-10 MED ORDER — LIDOCAINE HCL (CARDIAC) 20 MG/ML IV SOLN
INTRAVENOUS | Status: DC | PRN
  Administered 2016-06-10: 50 mg via INTRAVENOUS

## 2016-06-10 MED ORDER — ACETAMINOPHEN 500 MG PO TABS
1000.0000 mg | ORAL_TABLET | ORAL | Status: AC
  Administered 2016-06-10: 1000 mg via ORAL

## 2016-06-10 MED ORDER — CELECOXIB 200 MG PO CAPS
ORAL_CAPSULE | ORAL | Status: AC
  Filled 2016-06-10: qty 2

## 2016-06-10 MED ORDER — OXYCODONE HCL 5 MG PO TABS: 5.0000 mg | ORAL_TABLET | ORAL | 0 refills | Status: DC | PRN

## 2016-06-10 MED ORDER — METHOCARBAMOL 500 MG PO TABS
500.0000 mg | ORAL_TABLET | Freq: Three times a day (TID) | ORAL | 0 refills | Status: DC | PRN
Start: 2016-06-10 — End: 2016-09-16

## 2016-06-10 MED ORDER — ONDANSETRON HCL 4 MG/2ML IJ SOLN
INTRAMUSCULAR | Status: AC
  Filled 2016-06-10: qty 2

## 2016-06-10 MED ORDER — DEXAMETHASONE SODIUM PHOSPHATE 4 MG/ML IJ SOLN
INTRAMUSCULAR | Status: DC | PRN
  Administered 2016-06-10: 10 mg via INTRAVENOUS

## 2016-06-10 MED ORDER — CEFAZOLIN SODIUM-DEXTROSE 2-4 GM/100ML-% IV SOLN
2.0000 g | INTRAVENOUS | Status: AC
  Administered 2016-06-10: 2 g via INTRAVENOUS

## 2016-06-10 MED ORDER — LIDOCAINE 2% (20 MG/ML) 5 ML SYRINGE
INTRAMUSCULAR | Status: AC
  Filled 2016-06-10: qty 5

## 2016-06-10 SURGICAL SUPPLY — 87 items
BAG DECANTER FOR FLEXI CONT (MISCELLANEOUS) ×3 IMPLANT
BINDER ABDOMINAL 10 UNV 27-48 (MISCELLANEOUS) ×1 IMPLANT
BINDER ABDOMINAL 12 SM 30-45 (SOFTGOODS) IMPLANT
BINDER BREAST LRG (GAUZE/BANDAGES/DRESSINGS) ×1 IMPLANT
BINDER BREAST MEDIUM (GAUZE/BANDAGES/DRESSINGS) IMPLANT
BINDER BREAST XLRG (GAUZE/BANDAGES/DRESSINGS) IMPLANT
BINDER BREAST XXLRG (GAUZE/BANDAGES/DRESSINGS) IMPLANT
BLADE SURG 10 STRL SS (BLADE) ×2 IMPLANT
BLADE SURG 11 STRL SS (BLADE) ×3 IMPLANT
BLADE SURG 15 STRL LF DISP TIS (BLADE) IMPLANT
BLADE SURG 15 STRL SS (BLADE)
BNDG GAUZE ELAST 4 BULKY (GAUZE/BANDAGES/DRESSINGS) ×6 IMPLANT
CANISTER LIPO FAT HARVEST (MISCELLANEOUS) ×3 IMPLANT
CANISTER SUCT 1200ML W/VALVE (MISCELLANEOUS) ×4 IMPLANT
CHLORAPREP W/TINT 26ML (MISCELLANEOUS) ×4 IMPLANT
COMPRESSION GARMENT LG MOREWEL (MISCELLANEOUS) IMPLANT
COMPRESSION GARMENT MD MOREWEL (MISCELLANEOUS) IMPLANT
COMPRESSION GARMENT XL MOREWEL (MISCELLANEOUS) IMPLANT
COVER MAYO STAND STRL (DRAPES) ×3 IMPLANT
DECANTER SPIKE VIAL GLASS SM (MISCELLANEOUS) IMPLANT
DRAIN CHANNEL 15F RND FF W/TCR (WOUND CARE) IMPLANT
DRAPE IMP U-DRAPE 54X76 (DRAPES) IMPLANT
DRSG PAD ABDOMINAL 8X10 ST (GAUZE/BANDAGES/DRESSINGS) ×8 IMPLANT
ELECT BLADE 4.0 EZ CLEAN MEGAD (MISCELLANEOUS) ×3
ELECT COATED BLADE 2.86 ST (ELECTRODE) ×3 IMPLANT
ELECT REM PT RETURN 9FT ADLT (ELECTROSURGICAL) ×3
ELECTRODE BLDE 4.0 EZ CLN MEGD (MISCELLANEOUS) ×2 IMPLANT
ELECTRODE REM PT RTRN 9FT ADLT (ELECTROSURGICAL) ×2 IMPLANT
EVACUATOR SILICONE 100CC (DRAIN) IMPLANT
FILTER LIPOSUCTION (MISCELLANEOUS) ×3 IMPLANT
GLOVE BIO SURGEON STRL SZ 6 (GLOVE) ×6 IMPLANT
GLOVE BIO SURGEON STRL SZ 6.5 (GLOVE) ×1 IMPLANT
GLOVE BIOGEL PI IND STRL 7.0 (GLOVE) IMPLANT
GLOVE BIOGEL PI INDICATOR 7.0 (GLOVE) ×3
GLOVE ECLIPSE 6.5 STRL STRAW (GLOVE) ×1 IMPLANT
GOWN STRL REUS W/ TWL LRG LVL3 (GOWN DISPOSABLE) ×4 IMPLANT
GOWN STRL REUS W/TWL LRG LVL3 (GOWN DISPOSABLE) ×9
IMPL BREAST P6.1X12.5X 495 (Breast) IMPLANT
IMPL BREAST P6.3X FULL 560 (Breast) IMPLANT
IMPL BRST P6.1X12.5X 495CC (Breast) ×2 IMPLANT
IMPL BRST P6.3X FULL 560CC (Breast) ×2 IMPLANT
IMPLANT BREAST GEL 495CC (Breast) ×3 IMPLANT
IMPLANT BREAST GEL 560CC (Breast) ×3 IMPLANT
IV NS 500ML (IV SOLUTION)
IV NS 500ML BAXH (IV SOLUTION) ×2 IMPLANT
KIT FILL SYSTEM UNIVERSAL (SET/KITS/TRAYS/PACK) IMPLANT
LINER CANISTER 1000CC FLEX (MISCELLANEOUS) ×3 IMPLANT
LIQUID BAND (GAUZE/BANDAGES/DRESSINGS) ×6 IMPLANT
NDL SAFETY ECLIPSE 18X1.5 (NEEDLE) ×2 IMPLANT
NEEDLE HYPO 18GX1.5 SHARP (NEEDLE) ×9
NS IRRIG 1000ML POUR BTL (IV SOLUTION) ×3 IMPLANT
PACK BASIN DAY SURGERY FS (CUSTOM PROCEDURE TRAY) ×3 IMPLANT
PACK UNIVERSAL I (CUSTOM PROCEDURE TRAY) ×3 IMPLANT
PAD ALCOHOL SWAB (MISCELLANEOUS) ×3 IMPLANT
PENCIL BUTTON HOLSTER BLD 10FT (ELECTRODE) ×3 IMPLANT
PIN SAFETY STERILE (MISCELLANEOUS) IMPLANT
SHEET MEDIUM DRAPE 40X70 STRL (DRAPES) ×6 IMPLANT
SIZER BREAST REUSE 495CC (SIZER) ×3
SIZER BREAST REUSE GEL 525CC (SIZER) ×3
SIZER BREAST REUSE GEL 545CC (SIZER) ×3
SIZER BREAST REUSE GEL 560CC (SIZER) ×3
SIZER BRST REUSE 495CC (SIZER) IMPLANT
SIZER BRST REUSE GEL 525CC (SIZER) IMPLANT
SIZER BRST REUSE GEL 545CC (SIZER) IMPLANT
SIZER BRST REUSE GEL 560CC (SIZER) IMPLANT
SLEEVE SCD COMPRESS KNEE MED (MISCELLANEOUS) ×3 IMPLANT
SPONGE LAP 18X18 X RAY DECT (DISPOSABLE) ×8 IMPLANT
STAPLER VISISTAT 35W (STAPLE) ×3 IMPLANT
SUT ETHILON 2 0 FS 18 (SUTURE) IMPLANT
SUT MNCRL AB 4-0 PS2 18 (SUTURE) ×4 IMPLANT
SUT PDS AB 2-0 CT2 27 (SUTURE) ×2 IMPLANT
SUT VIC AB 3-0 PS1 18 (SUTURE)
SUT VIC AB 3-0 PS1 18XBRD (SUTURE) IMPLANT
SUT VIC AB 3-0 SH 27 (SUTURE) ×6
SUT VIC AB 3-0 SH 27X BRD (SUTURE) ×2 IMPLANT
SUT VICRYL 4-0 PS2 18IN ABS (SUTURE) ×4 IMPLANT
SYR 50ML LL SCALE MARK (SYRINGE) ×9 IMPLANT
SYR BULB IRRIGATION 50ML (SYRINGE) ×6 IMPLANT
SYR CONTROL 10ML LL (SYRINGE) IMPLANT
SYR TB 1ML LL NO SAFETY (SYRINGE) ×3 IMPLANT
SYRINGE 10CC LL (SYRINGE) ×10 IMPLANT
TOWEL OR 17X24 6PK STRL BLUE (TOWEL DISPOSABLE) ×6 IMPLANT
TUBE CONNECTING 20X1/4 (TUBING) ×6 IMPLANT
TUBING INFILTRATION IT-10001 (TUBING) ×3 IMPLANT
TUBING SET GRADUATE ASPIR 12FT (MISCELLANEOUS) ×3 IMPLANT
UNDERPAD 30X30 (UNDERPADS AND DIAPERS) ×6 IMPLANT
YANKAUER SUCT BULB TIP NO VENT (SUCTIONS) ×4 IMPLANT

## 2016-06-10 NOTE — Anesthesia Procedure Notes (Signed)
Procedure Name: Intubation Date/Time: 06/10/2016 7:37 AM Performed by: Marrianne Mood Pre-anesthesia Checklist: Patient identified, Emergency Drugs available, Suction available and Patient being monitored Patient Re-evaluated:Patient Re-evaluated prior to inductionOxygen Delivery Method: Circle system utilized Preoxygenation: Pre-oxygenation with 100% oxygen Intubation Type: IV induction Ventilation: Mask ventilation without difficulty Laryngoscope Size: Miller and 3 Tube type: Oral Tube size: 7.0 mm Number of attempts: 1 Airway Equipment and Method: Stylet and Oral airway Placement Confirmation: ETT inserted through vocal cords under direct vision,  positive ETCO2 and breath sounds checked- equal and bilateral Secured at: 22 cm Tube secured with: Tape Dental Injury: Teeth and Oropharynx as per pre-operative assessment

## 2016-06-10 NOTE — Transfer of Care (Signed)
Immediate Anesthesia Transfer of Care Note  Patient: Erica Burch  Procedure(s) Performed: Procedure(s): REMOVAL OF  BILATERAL TISSUE EXPANDERS AND PLACEMENT OF IMPLANTS (Bilateral) LIPOFILLING FROM ABDOMEN TO BILATERAL CHEST (Bilateral)  Patient Location: PACU  Anesthesia Type:General  Level of Consciousness: sedated  Airway & Oxygen Therapy: Patient Spontanous Breathing and Patient connected to face mask oxygen  Post-op Assessment: Report given to RN and Post -op Vital signs reviewed and stable  Post vital signs: Reviewed and stable  Last Vitals:  Vitals:   06/10/16 0624  BP: (!) 136/100  Pulse: 78  Resp: 18  Temp: 36.9 C    Last Pain:  Vitals:   06/10/16 0624  TempSrc: Oral         Complications: No apparent anesthesia complications

## 2016-06-10 NOTE — Op Note (Signed)
Operative Note   DATE OF OPERATION: 9.12.17  LOCATION: [facility]-outpatient  SURGICAL DIVISION: Plastic Surgery  PREOPERATIVE DIAGNOSES:  1. BRCA genetic carrier 2. Acquired absence breasts  POSTOPERATIVE DIAGNOSES:  same  PROCEDURE:  1. Removal bilateral expanders and placement silicone implants 2. Lipofilling from abomen to bilateral chest  SURGEON: Irene Limbo MD MBA  ASSISTANT: none  ANESTHESIA:  General.   EBL: 50 ml  COMPLICATIONS: None immediate.   INDICATIONS FOR PROCEDURE:  The patient, Erica Burch, is a 45 y.o. female born on Jul 14, 1971, is here for second stage breast reconstruction following bilateral prophylactics nipple sparing mastectomies.   FINDINGS: Erica Burch Extra Projection Cohesive Smooth Round implants placed. RIGHT SCX 560 [id number] LEFT SCX 495 [id number]. 110 ml fat infiltrated over right breast and 75 ml over left breast reconstruction. Acellular dermis well incorporated bilateral.  DESCRIPTION OF PROCEDURE:  The patient's operative site was marked with the patient in the preoperative area to mark sternal notch, anterior axillary lines, chest midline. Supra and infra umbilical abdomen marked for donor site for fat grafting. The patientwas taken to the operating room. SCDs were placed and IV antibiotics were given. The patient's operative site was prepped and draped in a sterile fashion. A time out was performed and all information was confirmed to be correct.Incision made in right inframammary fold mastectomy scar and implant capsule. Expander removed. Capsulotomies performed superiorly and additional submuscular dissection completed. Patient noted on this side to have lateral and inferior displacement of expander. Interruped 2-0 PDS suture used to reset inframammary fold between inferior capsule and chest wall. Laterally the lateral border of acellular dermis was resutured to serratus muscle and chest wall with interrupted 2-0 PDS.  Sizer placed. I then directed attention to left chest. Incision made in prior mastectomy scar and capsule incised. Expander removed and full incorporation acellular dermis noted. Capsulotomies performed superiorly and additional submuscular dissection completed. Sizer placed and patient brought to sitting position. Inspira Smooth Round Extra Projection 495 ml implant chosen for left and 560 ml implant chosen for right.   Stab incision made over bilateral lateral abdomen and tumescent fluid infiltrated over supra and infra umbilical abdomen,total 683 ml tumescent infiltrated. Power assisted liposuction performed to endpoint symmetric contour and soft tissue thickness. The fat was then washed and prepared by gravity for infiltration. Harvested fat was then infiltrated in subcutaneous and intramuscular planes throughout total envelope mastectomy flaps.   At this time breast cavities irrigated with solution containing Ancef, gentamicin, and bacitracin, followed by Betadine. Implant placed in right breast cavity. Care taken to ensure proper orientation. Closure was completed with running 3-0 vicryl for approximation of capsule and superficial fascia. 4-0 vicryl was placed in dermis and running 4-0 monocryl was used to close skin.Over left breast following implant placement, closure completed with running 3-0 vicryl to approximate capsule and superficial fascia. 4-0 vicryl was placed in dermis and running 4-0 monocryl was used to close skin.  Tissue adhesive applied to breast incisions. The abdominal incisions were approximated with 4-0 monocryl. Dry dressing and abdominal and breast binders applied.   The patient was allowed to wake from anesthesia, extubated and taken to the recovery room in satisfactory condition.   SPECIMENS: none  DRAINS: none  Irene Limbo, MD [facility] Plastic & Reconstructive Surgery [phone], pin [phone]

## 2016-06-10 NOTE — Interval H&P Note (Signed)
History and Physical Interval Note:  06/10/2016 6:47 AM  Erica Burch  has presented today for surgery, with the diagnosis of [place]  The various methods of treatment have been discussed with the patient and family. After consideration of risks, benefits and other options for treatment, the patient has consented to  Procedure(s): REMOVAL OF  BILATERAL TISSUE EXPANDER AND PLACEMENT OF IMPLANT (Bilateral) LIPOFILLING FROM ABDOMEN TO BILATERAL CHES (Bilateral) as a surgical intervention .  The patient's history has been reviewed, patient examined, no change in status, stable for surgery.  I have reviewed the patient's chart and labs.  Questions were answered to the patient's satisfaction.     Debbie Bellucci

## 2016-06-10 NOTE — Anesthesia Preprocedure Evaluation (Addendum)
Anesthesia Evaluation  Patient identified by MRN, date of birth, ID band Patient awake    Reviewed: Allergy & Precautions, NPO status , Patient's Chart, lab work & pertinent test results  Airway Mallampati: II  TM Distance: >3 FB Neck ROM: Full    Dental  (+) Teeth Intact, Dental Advisory Given   Pulmonary former smoker,    breath sounds clear to auscultation       Cardiovascular + Peripheral Vascular Disease   Rhythm:Regular Rate:Normal     Neuro/Psych PSYCHIATRIC DISORDERS Anxiety negative neurological ROS     GI/Hepatic negative GI ROS, Neg liver ROS,   Endo/Other  negative endocrine ROS  Renal/GU Renal disease  negative genitourinary   Musculoskeletal negative musculoskeletal ROS (+)   Abdominal   Peds negative pediatric ROS (+)  Hematology negative hematology ROS (+)   Anesthesia Other Findings   Reproductive/Obstetrics negative OB ROS                            Anesthesia Physical Anesthesia Plan  ASA: II  Anesthesia Plan: General   Post-op Pain Management:    Induction: Intravenous  Airway Management Planned: Oral ETT  Additional Equipment:   Intra-op Plan:   Post-operative Plan: Extubation in OR  Informed Consent: I have reviewed the patients History and Physical, chart, labs and discussed the procedure including the risks, benefits and alternatives for the proposed anesthesia with the patient or authorized representative who has indicated his/her understanding and acceptance.   Dental advisory given  Plan Discussed with: CRNA  Anesthesia Plan Comments:         Anesthesia Quick Evaluation

## 2016-06-10 NOTE — Discharge Instructions (Signed)

## 2016-06-11 ENCOUNTER — Encounter (HOSPITAL_BASED_OUTPATIENT_CLINIC_OR_DEPARTMENT_OTHER): Payer: Self-pay | Admitting: Plastic Surgery

## 2016-06-11 NOTE — Anesthesia Postprocedure Evaluation (Signed)
Anesthesia Post Note  Patient: Erica Burch  Procedure(s) Performed: Procedure(s) (LRB): REMOVAL OF  BILATERAL TISSUE EXPANDERS AND PLACEMENT OF IMPLANTS (Bilateral) LIPOFILLING FROM ABDOMEN TO BILATERAL CHEST (Bilateral)  Patient location during evaluation: PACU Anesthesia Type: General Level of consciousness: awake and alert Pain management: pain level controlled Vital Signs Assessment: post-procedure vital signs reviewed and stable Respiratory status: spontaneous breathing, nonlabored ventilation, respiratory function stable and patient connected to nasal cannula oxygen Cardiovascular status: blood pressure returned to baseline and stable Postop Assessment: no signs of nausea or vomiting Anesthetic complications: no    Last Vitals:  Vitals:   06/10/16 1200 06/10/16 1300  BP: 121/78 124/84  Pulse: 82 73  Resp: (!) 22 18  Temp: 36.6 C 36.7 C    Last Pain:  Vitals:   06/11/16 1013  TempSrc:   PainSc: 2                  Effie Berkshire

## 2016-07-28 ENCOUNTER — Encounter: Payer: Self-pay | Admitting: Gynecology

## 2016-07-28 ENCOUNTER — Ambulatory Visit (INDEPENDENT_AMBULATORY_CARE_PROVIDER_SITE_OTHER): Payer: BLUE CROSS/BLUE SHIELD | Admitting: Gynecology

## 2016-07-28 VITALS — BP 128/82 | Ht 64.5 in | Wt 164.8 lb

## 2016-07-28 DIAGNOSIS — Z1501 Genetic susceptibility to malignant neoplasm of breast: Secondary | ICD-10-CM

## 2016-07-28 DIAGNOSIS — Z1502 Genetic susceptibility to malignant neoplasm of ovary: Secondary | ICD-10-CM | POA: Diagnosis not present

## 2016-07-28 DIAGNOSIS — Z1509 Genetic susceptibility to other malignant neoplasm: Secondary | ICD-10-CM

## 2016-07-28 DIAGNOSIS — Z23 Encounter for immunization: Secondary | ICD-10-CM | POA: Diagnosis not present

## 2016-07-28 DIAGNOSIS — Z01419 Encounter for gynecological examination (general) (routine) without abnormal findings: Secondary | ICD-10-CM | POA: Diagnosis not present

## 2016-07-28 DIAGNOSIS — Z1589 Genetic susceptibility to other disease: Secondary | ICD-10-CM

## 2016-07-28 LAB — CBC WITH DIFFERENTIAL/PLATELET
BASOS ABS: 41 {cells}/uL (ref 0–200)
Basophils Relative: 1 %
EOS ABS: 82 {cells}/uL (ref 15–500)
Eosinophils Relative: 2 %
HEMATOCRIT: 38.5 % (ref 35.0–45.0)
HEMOGLOBIN: 13.1 g/dL (ref 11.7–15.5)
LYMPHS ABS: 1845 {cells}/uL (ref 850–3900)
LYMPHS PCT: 45 %
MCH: 27.8 pg (ref 27.0–33.0)
MCHC: 34 g/dL (ref 32.0–36.0)
MCV: 81.7 fL (ref 80.0–100.0)
MONO ABS: 369 {cells}/uL (ref 200–950)
MPV: 9.5 fL (ref 7.5–12.5)
Monocytes Relative: 9 %
NEUTROS PCT: 43 %
Neutro Abs: 1763 cells/uL (ref 1500–7800)
Platelets: 273 10*3/uL (ref 140–400)
RBC: 4.71 MIL/uL (ref 3.80–5.10)
RDW: 14.3 % (ref 11.0–15.0)
WBC: 4.1 10*3/uL (ref 3.8–10.8)

## 2016-07-28 LAB — LIPID PANEL
CHOL/HDL RATIO: 4.5 ratio (ref <=5.0)
Cholesterol: 187 mg/dL (ref 125–200)
HDL: 42 mg/dL — AB (ref >=46)
LDL CALC: 117 mg/dL (ref <130)
TRIGLYCERIDES: 140 mg/dL (ref <150)
VLDL: 28 mg/dL (ref <30)

## 2016-07-28 LAB — COMPREHENSIVE METABOLIC PANEL
ALBUMIN: 4.2 g/dL (ref 3.6–5.1)
ALK PHOS: 77 U/L (ref 33–115)
ALT: 11 U/L (ref 6–29)
AST: 16 U/L (ref 10–30)
BILIRUBIN TOTAL: 0.7 mg/dL (ref 0.2–1.2)
BUN: 9 mg/dL (ref 7–25)
CALCIUM: 9.4 mg/dL (ref 8.6–10.2)
CO2: 28 mmol/L (ref 20–31)
Chloride: 106 mmol/L (ref 98–110)
Creat: 0.89 mg/dL (ref 0.50–1.10)
Glucose, Bld: 87 mg/dL (ref 65–99)
POTASSIUM: 4.3 mmol/L (ref 3.5–5.3)
Sodium: 141 mmol/L (ref 135–146)
Total Protein: 6.8 g/dL (ref 6.1–8.1)

## 2016-07-28 LAB — TSH: TSH: 1.64 mIU/L

## 2016-07-28 NOTE — Progress Notes (Signed)
Erica Burch Jun 25, 1971 [id number]   History:    45 y.o.  for annual gyn exam who has no complaints today. Patient also requested to receive the flu vaccine today.Review of her record indicated that in 2012 had a total abdominal hysterectomy and bilateral salpingo-oophorectomy do to her strong family history of primary breast cancer and personal history of positive BRCA1 mutation. Patient in May of this year had bilateral mastectomy in September this year had implants placed for cosmetics. She is not using any low dose transdermal estrogen as had been discussed with her oncologist in the past. She has very few if any vasomotor symptoms.Dr.Wrenn urologist had performed a left ureteroscopy with stone extraction and patient has done well. Patient prior to her hysterectomy had no history of abnormal Pap smear. Patient's last bone density study in 2016 which was normal.  Past medical history,surgical history, family history and social history were all reviewed and documented in the EPIC chart.  Gynecologic History Patient's last menstrual period was 04/28/2009. Contraception: status post hysterectomy Last Pap: 2012. Results were: normal Last mammogram: 2017. Results were: normal  Obstetric History OB History  Gravida Para Term Preterm AB Living  3 3 3         SAB TAB Ectopic Multiple Live Births               # Outcome Date GA Lbr Len/2nd Weight Sex Delivery Anes PTL Lv  3 Term     M Vag-Spont     2 Term     F Vag-Spont     1 Term     F Vag-Spont          ROS: A ROS was performed and pertinent positives and negatives are included in the history.  GENERAL: No fevers or chills. HEENT: No change in vision, no earache, sore throat or sinus congestion. NECK: No pain or stiffness. CARDIOVASCULAR: No chest pain or pressure. No palpitations. PULMONARY: No shortness of breath, cough or wheeze. GASTROINTESTINAL: No abdominal pain, nausea, vomiting or diarrhea, melena or bright red blood per  rectum. GENITOURINARY: No urinary frequency, urgency, hesitancy or dysuria. MUSCULOSKELETAL: No joint or muscle pain, no back pain, no recent trauma. DERMATOLOGIC: No rash, no itching, no lesions. ENDOCRINE: No polyuria, polydipsia, no heat or cold intolerance. No recent change in weight. HEMATOLOGICAL: No anemia or easy bruising or bleeding. NEUROLOGIC: No headache, seizures, numbness, tingling or weakness. PSYCHIATRIC: No depression, no loss of interest in normal activity or change in sleep pattern.     Exam: chaperone present  BP 128/82   Ht 5' 4.5" (1.638 m)   Wt 164 lb 12.8 oz (74.8 kg)   LMP 04/28/2009   BMI 27.85 kg/m   Body mass index is 27.85 kg/m.  General appearance : Well developed well nourished female. No acute distress HEENT: Eyes: no retinal hemorrhage or exudates,  Neck supple, trachea midline, no carotid bruits, no thyroidmegaly Lungs: Clear to auscultation, no rhonchi or wheezes, or rib retractions  Heart: Regular rate and rhythm, no murmurs or gallops Breast:Examined in sitting and supine position were symmetrical in appearance, no palpable masses or tenderness,  no skin retraction, no nipple inversion, no nipple discharge, no skin discoloration, no axillary or supraclavicular lymphadenopathy Abdomen: no palpable masses or tenderness, no rebound or guarding Extremities: no edema or skin discoloration or tenderness  Pelvic:  Bartholin, Urethra, Skene Glands: Within normal limits             Vagina: No gross lesions  or discharge  Cervix: Absent  Uterus  absent  Adnexa  Without masses or tenderness  Anus and perineum  normal   Rectovaginal  normal sphincter tone without palpated masses or tenderness             Hemoccult not indicated     Assessment/Plan:  45 y.o. female for annual exam status post total abdominal hysterectomy with bilateral salpingo-oophorectomy in 2012 as a result of strong first-line relatives with breast cancer and patient herself with  positive BRCA1 gene mutation. Patient status post bilateral mastectomy and implant placement. Bone density study last year was normal. She was instructed to continue her calcium vitamin D and weightbearing exercises for osteoporosis prevention. She will need a bone density study next year. She was reminded also for her daughter to be screened for the BRCA1  gene mutation. Pap smear not indicated. The following screening blood work was ordered today: Comprehensive metabolic panel, fasting lipid profile, TSH, CBC, and urinalysis. She did receive the flu vaccine today.    Terrance Mass MD, 11:53 AM 07/28/2016

## 2016-07-28 NOTE — Patient Instructions (Signed)

## 2016-07-29 LAB — URINALYSIS W MICROSCOPIC + REFLEX CULTURE
Bacteria, UA: NONE SEEN [HPF]
Bilirubin Urine: NEGATIVE
Casts: NONE SEEN [LPF]
Crystals: NONE SEEN [HPF]
GLUCOSE, UA: NEGATIVE
Ketones, ur: NEGATIVE
LEUKOCYTES UA: NEGATIVE
NITRITE: NEGATIVE
PH: 8 (ref 5.0–8.0)
Protein, ur: NEGATIVE
RBC / HPF: NONE SEEN RBC/HPF (ref <=2)
SQUAMOUS EPITHELIAL / LPF: NONE SEEN [HPF] (ref <=5)
Specific Gravity, Urine: 1.013 (ref 1.001–1.035)
WBC, UA: NONE SEEN WBC/HPF (ref <=5)
YEAST: NONE SEEN [HPF]

## 2016-09-16 ENCOUNTER — Encounter (HOSPITAL_BASED_OUTPATIENT_CLINIC_OR_DEPARTMENT_OTHER): Payer: Self-pay | Admitting: *Deleted

## 2016-09-18 NOTE — H&P (Signed)
  Subjective:     Patient ID: Erica Burch is a 45 y.o. female.  HPI  3 months post op implant exchange. Plan revision surgery due to implant displacement.  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 Mother underwent implant based reconstruction with latissimus on right and direct to implant on left, no history expanders for her. Final pathology benign.  Patient last MMG 11/2015, had biopsy on right and benign. One prior lumpectomy on left at [facility], benign. Screening MRI negative.  Prior 62 D, desires smaller. Right mastectomy 469 g Left 403 g   Review of Systems    Objective:   Physical Exam  Cardiovascular: Normal rate, regular rhythm and normal heart sounds.   Pulmonary/Chest: Effort normal and breath sounds normal.  Abdominal: Soft.   SN to nipple R 23 L 23 cm  Chest:   soft, symmetric overall volume, in supine position lateral displacement implant greater on right Bilateral bottoming out noted with depression superior pole Abdomen: soft   Assessment:     BRCA1 S/p bilateral NSM, TE/ADM reconstruction S/p silicone implant exchange, lipofilling    Plan:     Reviewed I did complete suture capsulorraphy at last surgery and has recurrent displacement. She desires intervention, and plan use additional ADM to narrow pocket, stabilize lateral border. Will plan to do this bilateral. We discussed additional measures such as textured implants to prevent displacement, this would carry risk ALCL. She declines textured implant at this time. Also dicussed implant size, she reports symmetric volume in bra but feels a little heavy. We reviewed implant size vs mastectomy weights, offered to decrease implant volume, however I suspect based on width of chest the change in volume would be <100 ml. Following discussion we plan to keep same implant  volume/projection and remain with smooth implants. Plan repeat fat graft bilateral. Reviewed variable take graft, donor site pain and risk deformity, risk fat necrosis that presents as masses. She would like to continue with OP surgery, understands I will likely place drains this procedure.  Natrelle Desma Maxim Extra Projection Cohesive Smooth Round implants placed.  RIGHT SCX 560 LEFT SCX 495 .  110 ml fat infiltrated over right breast and 75 ml over left breast reconstruction  Irene Limbo, MD [facility] [place] [facility] [phone], pin [phone]

## 2016-09-23 ENCOUNTER — Ambulatory Visit (HOSPITAL_BASED_OUTPATIENT_CLINIC_OR_DEPARTMENT_OTHER): Payer: BLUE CROSS/BLUE SHIELD | Admitting: Anesthesiology

## 2016-09-23 ENCOUNTER — Encounter (HOSPITAL_BASED_OUTPATIENT_CLINIC_OR_DEPARTMENT_OTHER)
Admission: RE | Disposition: A | Discharge status: Home / Self Care | Payer: Self-pay | Source: Ambulatory Visit | Attending: Plastic Surgery

## 2016-09-23 ENCOUNTER — Encounter (HOSPITAL_BASED_OUTPATIENT_CLINIC_OR_DEPARTMENT_OTHER): Payer: Self-pay | Admitting: Anesthesiology

## 2016-09-23 ENCOUNTER — Ambulatory Visit (HOSPITAL_BASED_OUTPATIENT_CLINIC_OR_DEPARTMENT_OTHER)
Admission: RE | Admit: 2016-09-23 | Discharge: 2016-09-23 | Disposition: A | Discharge status: Home / Self Care | Payer: BLUE CROSS/BLUE SHIELD | Source: Ambulatory Visit | Attending: Plastic Surgery | Admitting: Plastic Surgery

## 2016-09-23 DIAGNOSIS — Z87891 Personal history of nicotine dependence: Secondary | ICD-10-CM | POA: Insufficient documentation

## 2016-09-23 DIAGNOSIS — Z853 Personal history of malignant neoplasm of breast: Secondary | ICD-10-CM | POA: Diagnosis not present

## 2016-09-23 DIAGNOSIS — Z9013 Acquired absence of bilateral breasts and nipples: Secondary | ICD-10-CM | POA: Insufficient documentation

## 2016-09-23 DIAGNOSIS — Z1501 Genetic susceptibility to malignant neoplasm of breast: Secondary | ICD-10-CM | POA: Insufficient documentation

## 2016-09-23 DIAGNOSIS — F419 Anxiety disorder, unspecified: Secondary | ICD-10-CM | POA: Insufficient documentation

## 2016-09-23 DIAGNOSIS — T8542XA Displacement of breast prosthesis and implant, initial encounter: Secondary | ICD-10-CM | POA: Diagnosis not present

## 2016-09-23 DIAGNOSIS — Z803 Family history of malignant neoplasm of breast: Secondary | ICD-10-CM | POA: Diagnosis not present

## 2016-09-23 DIAGNOSIS — Y831 Surgical operation with implant of artificial internal device as the cause of abnormal reaction of the patient, or of later complication, without mention of misadventure at the time of the procedure: Secondary | ICD-10-CM | POA: Insufficient documentation

## 2016-09-23 HISTORY — PX: BREAST RECONSTRUCTION: SHX9

## 2016-09-23 HISTORY — PX: LIPOSUCTION WITH LIPOFILLING: SHX6436

## 2016-09-23 SURGERY — LIPOSUCTION, WITH FAT TRANSFER
Anesthesia: General | Site: Breast | Laterality: Bilateral

## 2016-09-23 MED ORDER — MIDAZOLAM HCL 2 MG/2ML IJ SOLN: 1.0000 mg | INTRAMUSCULAR | Status: DC | PRN

## 2016-09-23 MED ORDER — EPINEPHRINE 30 MG/30ML IJ SOLN
INTRAMUSCULAR | Status: AC
  Filled 2016-09-23: qty 1

## 2016-09-23 MED ORDER — SODIUM BICARBONATE 4 % IV SOLN
INTRAVENOUS | Status: AC
  Filled 2016-09-23: qty 5

## 2016-09-23 MED ORDER — SUCCINYLCHOLINE CHLORIDE 200 MG/10ML IV SOSY
PREFILLED_SYRINGE | INTRAVENOUS | Status: AC
  Filled 2016-09-23: qty 10

## 2016-09-23 MED ORDER — BUPIVACAINE-EPINEPHRINE (PF) 0.25% -1:200000 IJ SOLN
INTRAMUSCULAR | Status: AC
  Filled 2016-09-23: qty 30

## 2016-09-23 MED ORDER — HYDROMORPHONE HCL 1 MG/ML IJ SOLN
0.2500 mg | INTRAMUSCULAR | Status: DC | PRN
  Administered 2016-09-23 (×3): 0.5 mg via INTRAVENOUS

## 2016-09-23 MED ORDER — LIDOCAINE HCL (CARDIAC) 20 MG/ML IV SOLN: INTRAVENOUS | Status: DC | PRN

## 2016-09-23 MED ORDER — CELECOXIB 400 MG PO CAPS
400.0000 mg | ORAL_CAPSULE | ORAL | Status: AC
  Administered 2016-09-23: 400 mg via ORAL

## 2016-09-23 MED ORDER — ACETAMINOPHEN 500 MG PO TABS
ORAL_TABLET | ORAL | Status: AC
  Filled 2016-09-23: qty 2

## 2016-09-23 MED ORDER — 0.9 % SODIUM CHLORIDE (POUR BTL) OPTIME
TOPICAL | Status: DC | PRN
  Administered 2016-09-23 (×2): 1000 mL

## 2016-09-23 MED ORDER — HYDROMORPHONE HCL 1 MG/ML IJ SOLN
INTRAMUSCULAR | Status: AC
  Filled 2016-09-23: qty 1

## 2016-09-23 MED ORDER — SULFAMETHOXAZOLE-TRIMETHOPRIM 800-160 MG PO TABS: 1.0000 | ORAL_TABLET | Freq: Two times a day (BID) | ORAL | 0 refills | Status: DC

## 2016-09-23 MED ORDER — SUCCINYLCHOLINE CHLORIDE 20 MG/ML IJ SOLN
INTRAMUSCULAR | Status: DC | PRN
  Administered 2016-09-23: 100 mg via INTRAVENOUS

## 2016-09-23 MED ORDER — EPHEDRINE SULFATE 50 MG/ML IJ SOLN
INTRAMUSCULAR | Status: DC | PRN
  Administered 2016-09-23 (×2): 10 mg via INTRAVENOUS

## 2016-09-23 MED ORDER — SUCCINYLCHOLINE CHLORIDE 20 MG/ML IJ SOLN: INTRAMUSCULAR | Status: DC | PRN

## 2016-09-23 MED ORDER — CEFAZOLIN SODIUM-DEXTROSE 2-4 GM/100ML-% IV SOLN
INTRAVENOUS | Status: AC
  Filled 2016-09-23: qty 100

## 2016-09-23 MED ORDER — GABAPENTIN 300 MG PO CAPS
ORAL_CAPSULE | ORAL | Status: AC
  Filled 2016-09-23: qty 1

## 2016-09-23 MED ORDER — ACETAMINOPHEN 500 MG PO TABS
1000.0000 mg | ORAL_TABLET | ORAL | Status: AC
  Administered 2016-09-23: 1000 mg via ORAL

## 2016-09-23 MED ORDER — DEXAMETHASONE SODIUM PHOSPHATE 10 MG/ML IJ SOLN
INTRAMUSCULAR | Status: AC
  Filled 2016-09-23: qty 1

## 2016-09-23 MED ORDER — SODIUM CHLORIDE 0.9 % IJ SOLN
INTRAMUSCULAR | Status: AC
  Filled 2016-09-23: qty 10

## 2016-09-23 MED ORDER — LACTATED RINGERS IV SOLN
INTRAVENOUS | Status: DC | PRN
  Administered 2016-09-23 (×3): via INTRAVENOUS

## 2016-09-23 MED ORDER — FENTANYL CITRATE (PF) 100 MCG/2ML IJ SOLN
INTRAMUSCULAR | Status: DC | PRN
  Administered 2016-09-23 (×3): 100 ug via INTRAVENOUS

## 2016-09-23 MED ORDER — LIDOCAINE HCL (PF) 1 % IJ SOLN
INTRAMUSCULAR | Status: AC
  Filled 2016-09-23: qty 30

## 2016-09-23 MED ORDER — KETOROLAC TROMETHAMINE 30 MG/ML IJ SOLN
INTRAMUSCULAR | Status: AC
  Filled 2016-09-23: qty 1

## 2016-09-23 MED ORDER — METHYLENE BLUE 0.5 % INJ SOLN
INTRAVENOUS | Status: AC
  Filled 2016-09-23: qty 10

## 2016-09-23 MED ORDER — FENTANYL CITRATE (PF) 100 MCG/2ML IJ SOLN
INTRAMUSCULAR | Status: AC
  Filled 2016-09-23: qty 2

## 2016-09-23 MED ORDER — CHLORHEXIDINE GLUCONATE CLOTH 2 % EX PADS: 6.0000 | MEDICATED_PAD | Freq: Once | CUTANEOUS | Status: DC

## 2016-09-23 MED ORDER — CEFAZOLIN SODIUM-DEXTROSE 2-3 GM-% IV SOLR
INTRAVENOUS | Status: DC | PRN
  Administered 2016-09-23: 2 g via INTRAVENOUS

## 2016-09-23 MED ORDER — KETOROLAC TROMETHAMINE 30 MG/ML IJ SOLN
INTRAMUSCULAR | Status: DC | PRN
  Administered 2016-09-23: 30 mg via INTRAVENOUS

## 2016-09-23 MED ORDER — FENTANYL CITRATE (PF) 100 MCG/2ML IJ SOLN: 50.0000 ug | INTRAMUSCULAR | Status: DC | PRN

## 2016-09-23 MED ORDER — SODIUM BICARBONATE 4 % IV SOLN
INTRAVENOUS | Status: DC | PRN
  Administered 2016-09-23: 1061 mL via INTRAMUSCULAR

## 2016-09-23 MED ORDER — GENTAMICIN SULFATE 40 MG/ML IJ SOLN
INTRAMUSCULAR | Status: DC | PRN
  Administered 2016-09-23: 1000 mL

## 2016-09-23 MED ORDER — OXYCODONE HCL 5 MG PO TABS: 5.0000 mg | ORAL_TABLET | ORAL | 0 refills | Status: DC | PRN

## 2016-09-23 MED ORDER — LIDOCAINE 2% (20 MG/ML) 5 ML SYRINGE
INTRAMUSCULAR | Status: AC
  Filled 2016-09-23: qty 5

## 2016-09-23 MED ORDER — LIDOCAINE HCL (CARDIAC) 20 MG/ML IV SOLN
INTRAVENOUS | Status: DC | PRN
  Administered 2016-09-23: 60 mg via INTRAVENOUS

## 2016-09-23 MED ORDER — ONDANSETRON HCL 4 MG/2ML IJ SOLN
INTRAMUSCULAR | Status: AC
  Filled 2016-09-23: qty 2

## 2016-09-23 MED ORDER — LACTATED RINGERS IV SOLN
INTRAVENOUS | Status: DC
  Administered 2016-09-23: 07:00:00 via INTRAVENOUS

## 2016-09-23 MED ORDER — DEXAMETHASONE SODIUM PHOSPHATE 4 MG/ML IJ SOLN
INTRAMUSCULAR | Status: DC | PRN
  Administered 2016-09-23: 10 mg via INTRAVENOUS

## 2016-09-23 MED ORDER — CELECOXIB 200 MG PO CAPS
ORAL_CAPSULE | ORAL | Status: AC
  Filled 2016-09-23: qty 2

## 2016-09-23 MED ORDER — PROPOFOL 10 MG/ML IV BOLUS
INTRAVENOUS | Status: DC | PRN
  Administered 2016-09-23: 150 mg via INTRAVENOUS
  Administered 2016-09-23 (×3): 50 mg via INTRAVENOUS

## 2016-09-23 MED ORDER — CEFAZOLIN SODIUM-DEXTROSE 2-4 GM/100ML-% IV SOLN: 2.0000 g | INTRAVENOUS | Status: DC

## 2016-09-23 MED ORDER — MIDAZOLAM HCL 2 MG/2ML IJ SOLN
INTRAMUSCULAR | Status: AC
  Filled 2016-09-23: qty 2

## 2016-09-23 MED ORDER — GABAPENTIN 300 MG PO CAPS
300.0000 mg | ORAL_CAPSULE | ORAL | Status: AC
  Administered 2016-09-23: 300 mg via ORAL

## 2016-09-23 MED ORDER — ONDANSETRON HCL 4 MG/2ML IJ SOLN
INTRAMUSCULAR | Status: DC | PRN
  Administered 2016-09-23: 4 mg via INTRAVENOUS

## 2016-09-23 MED ORDER — CHLORHEXIDINE GLUCONATE CLOTH 2 % EX PADS
6.0000 | MEDICATED_PAD | Freq: Once | CUTANEOUS | Status: DC
Start: 2016-09-23 — End: 2016-09-23

## 2016-09-23 MED ORDER — PROPOFOL 500 MG/50ML IV EMUL
INTRAVENOUS | Status: AC
  Filled 2016-09-23: qty 50

## 2016-09-23 MED ORDER — MIDAZOLAM HCL 5 MG/5ML IJ SOLN
INTRAMUSCULAR | Status: DC | PRN
  Administered 2016-09-23: 2 mg via INTRAVENOUS

## 2016-09-23 MED ORDER — SCOPOLAMINE 1 MG/3DAYS TD PT72: 1.0000 | MEDICATED_PATCH | Freq: Once | TRANSDERMAL | Status: DC | PRN

## 2016-09-23 SURGICAL SUPPLY — 85 items
ALLODERM 8X16 READY TO USE (Tissue) ×1 IMPLANT
ALLODERM RTU 8X16 (Tissue) ×2 IMPLANT
BAG DECANTER FOR FLEXI CONT (MISCELLANEOUS) ×3 IMPLANT
BANDAGE ACE 6X5 VEL STRL LF (GAUZE/BANDAGES/DRESSINGS) IMPLANT
BINDER ABDOMINAL 10 UNV 27-48 (MISCELLANEOUS) IMPLANT
BINDER ABDOMINAL 12 SM 30-45 (SOFTGOODS) IMPLANT
BINDER BREAST LRG (GAUZE/BANDAGES/DRESSINGS) IMPLANT
BINDER BREAST MEDIUM (GAUZE/BANDAGES/DRESSINGS) IMPLANT
BINDER BREAST XLRG (GAUZE/BANDAGES/DRESSINGS) IMPLANT
BINDER BREAST XXLRG (GAUZE/BANDAGES/DRESSINGS) IMPLANT
BLADE SURG 10 STRL SS (BLADE) ×8 IMPLANT
BLADE SURG 11 STRL SS (BLADE) ×3 IMPLANT
BLADE SURG 15 STRL LF DISP TIS (BLADE) ×2 IMPLANT
BLADE SURG 15 STRL SS (BLADE) ×3
BNDG GAUZE ELAST 4 BULKY (GAUZE/BANDAGES/DRESSINGS) ×6 IMPLANT
CANISTER LIPO FAT HARVEST (MISCELLANEOUS) ×3 IMPLANT
CANISTER SUCT 1200ML W/VALVE (MISCELLANEOUS) ×3 IMPLANT
CHLORAPREP W/TINT 26ML (MISCELLANEOUS) ×3 IMPLANT
COMPRESSION GARMENT LG MOREWEL (MISCELLANEOUS) IMPLANT
COMPRESSION GARMENT MD MOREWEL (MISCELLANEOUS) IMPLANT
COMPRESSION GARMENT XL MOREWEL (MISCELLANEOUS) IMPLANT
COVER BACK TABLE 60X90IN (DRAPES) ×3 IMPLANT
COVER MAYO STAND STRL (DRAPES) ×3 IMPLANT
DECANTER SPIKE VIAL GLASS SM (MISCELLANEOUS) IMPLANT
DRAIN CHANNEL 15F RND FF W/TCR (WOUND CARE) ×3 IMPLANT
DRAPE TOP ARMCOVERS (MISCELLANEOUS) ×3 IMPLANT
DRAPE U-SHAPE 76X120 STRL (DRAPES) ×3 IMPLANT
DRSG PAD ABDOMINAL 8X10 ST (GAUZE/BANDAGES/DRESSINGS) ×6 IMPLANT
ELECT BLADE 4.0 EZ CLEAN MEGAD (MISCELLANEOUS) ×3
ELECT COATED BLADE 2.86 ST (ELECTRODE) ×3 IMPLANT
ELECT REM PT RETURN 9FT ADLT (ELECTROSURGICAL) ×3
ELECTRODE BLDE 4.0 EZ CLN MEGD (MISCELLANEOUS) ×2 IMPLANT
ELECTRODE REM PT RTRN 9FT ADLT (ELECTROSURGICAL) ×2 IMPLANT
EVACUATOR SILICONE 100CC (DRAIN) ×3 IMPLANT
FILTER LIPOSUCTION (MISCELLANEOUS) ×3 IMPLANT
GLOVE BIO SURGEON STRL SZ 6 (GLOVE) ×3 IMPLANT
GLOVE BIO SURGEON STRL SZ 6.5 (GLOVE) IMPLANT
GOWN STRL REUS W/ TWL LRG LVL3 (GOWN DISPOSABLE) ×4 IMPLANT
GOWN STRL REUS W/TWL LRG LVL3 (GOWN DISPOSABLE) ×6
IMPL BREAST P6.3XRND MDRT 560 (Breast) ×1 IMPLANT
IMPL BREAST SILICONE 495CC (Breast) ×1 IMPLANT
IMPL BRST P6.3XRND MDRT 560CC (Breast) ×2 IMPLANT
IMPLANT BREAST GEL 560CC (Breast) ×3 IMPLANT
IMPLANT BREAST SILICONE 495CC (Breast) ×3 IMPLANT
IV NS 500ML (IV SOLUTION) ×3
IV NS 500ML BAXH (IV SOLUTION) ×2 IMPLANT
KIT FILL SYSTEM UNIVERSAL (SET/KITS/TRAYS/PACK) IMPLANT
LINER CANISTER 1000CC FLEX (MISCELLANEOUS) ×3 IMPLANT
LIQUID BAND (GAUZE/BANDAGES/DRESSINGS) ×6 IMPLANT
NDL HYPO 25X1 1.5 SAFETY (NEEDLE) IMPLANT
NDL SAFETY ECLIPSE 18X1.5 (NEEDLE) ×2 IMPLANT
NEEDLE HYPO 18GX1.5 SHARP (NEEDLE) ×3
NEEDLE HYPO 25X1 1.5 SAFETY (NEEDLE) IMPLANT
NS IRRIG 1000ML POUR BTL (IV SOLUTION) ×3 IMPLANT
PACK BASIN DAY SURGERY FS (CUSTOM PROCEDURE TRAY) ×3 IMPLANT
PAD ALCOHOL SWAB (MISCELLANEOUS) ×3 IMPLANT
PENCIL BUTTON HOLSTER BLD 10FT (ELECTRODE) ×3 IMPLANT
PIN SAFETY STERILE (MISCELLANEOUS) ×3 IMPLANT
SHEET MEDIUM DRAPE 40X70 STRL (DRAPES) ×6 IMPLANT
SLEEVE SCD COMPRESS KNEE MED (MISCELLANEOUS) ×3 IMPLANT
SPONGE LAP 18X18 X RAY DECT (DISPOSABLE) ×6 IMPLANT
STAPLER VISISTAT 35W (STAPLE) ×3 IMPLANT
SUT ETHILON 2 0 FS 18 (SUTURE) ×3 IMPLANT
SUT MNCRL AB 4-0 PS2 18 (SUTURE) ×3 IMPLANT
SUT PDS 3-0 CT2 (SUTURE)
SUT PDS AB 2-0 CT2 27 (SUTURE) ×10 IMPLANT
SUT PDS II 3-0 CT2 27 ABS (SUTURE) IMPLANT
SUT PROLENE 2 0 CT2 30 (SUTURE) IMPLANT
SUT VIC AB 3-0 PS1 18 (SUTURE) ×3
SUT VIC AB 3-0 PS1 18XBRD (SUTURE) ×2 IMPLANT
SUT VIC AB 3-0 SH 27 (SUTURE)
SUT VIC AB 3-0 SH 27X BRD (SUTURE) IMPLANT
SUT VICRYL 4-0 PS2 18IN ABS (SUTURE) ×3 IMPLANT
SYR 10ML LL (SYRINGE) ×9 IMPLANT
SYR 50ML LL SCALE MARK (SYRINGE) ×12 IMPLANT
SYR BULB IRRIGATION 50ML (SYRINGE) ×3 IMPLANT
SYR CONTROL 10ML LL (SYRINGE) IMPLANT
SYR TB 1ML LL NO SAFETY (SYRINGE) ×3 IMPLANT
TISSUE ALLDRM RTU 8X16 (Tissue) ×1 IMPLANT
TOWEL OR 17X24 6PK STRL BLUE (TOWEL DISPOSABLE) ×6 IMPLANT
TUBE CONNECTING 20X1/4 (TUBING) ×3 IMPLANT
TUBING INFILTRATION IT-10001 (TUBING) ×3 IMPLANT
TUBING SET GRADUATE ASPIR 12FT (MISCELLANEOUS) ×3 IMPLANT
UNDERPAD 30X30 (UNDERPADS AND DIAPERS) ×6 IMPLANT
YANKAUER SUCT BULB TIP NO VENT (SUCTIONS) ×3 IMPLANT

## 2016-09-23 NOTE — Op Note (Signed)
Operative Note   DATE OF OPERATION: 12.26.17  LOCATION: [facility]-outpatient  SURGICAL DIVISION: Plastic Surgery  PREOPERATIVE DIAGNOSES:  1. BRCA 2. Acquired absence breasts  POSTOPERATIVE DIAGNOSES:  same  PROCEDURE:  1. Revision bilateral breast reconstruction with silicone implant exchange, lipofilling 2. Acellular dermis (Alloderm) to bilateral breast reconstruction 100 cm2  SURGEON: Irene Limbo MD MBA  ASSISTANT: none  ANESTHESIA:  General.   EBL: 25 ml  COMPLICATIONS: None immediate.   INDICATIONS FOR PROCEDURE:  The patient, Erica Burch, is a 45 y.o. female born on 1970/12/07, is here for revision bilateral breast reconstruction. She presents with bilateral lateral displacement implants that has failed prior suture capsulorraphy.   FINDINGS: Removed intact Natrelle Cohesive Smooth Round Extra Projection SCX-495 from left chest, SCX-560 from right chest. Herma Carson Smooth Round Extra Projection implants placed, RIGHT REF [place], SN [id number]. LEFT REF SRX-495, SN [id number]. 70 ml fat infiltrated over right chest, 80 ml fat over left chest.  DESCRIPTION OF PROCEDURE:  The patient's operative site was marked with the patient in the preoperative area to mark sternal notch, desired anterior axillary lines, chest midline. Supra and infra umbilical abdomen, bilateral flanks marked for donor site for fat grafting. The patientwas taken to the operating room. SCDs were placed and IV antibiotics were given. The patient's operative site was prepped and draped in a sterile fashion. A time out was performed and all information was confirmed to be correct.Incision made in right inframammary fold mastectomy scar and implant capsule. Intact implant removed. "Popcorn" capsulorraphy completed over inferior capsule and lateral capsule. Interruped 0 vicryl suture used to reset inframammary fold between inferior capsule and chest wall. Laterally the lateral border of acellular  dermis that had been resutured to serratus muscle and chest wall at last surgery was noted to have failed. Acellular dermis perforated and inset to chest wall with 2-0 PDS at desired anterior axillary line. The ADM was then redraped over prior implant and sutured to anterior capsule with 2-0 PDS. Additional plication suture placed with interrupted 2-0 PDS to chest wall and anterior mastectomy flap capsule. 15 Fr JP placed and secured with 2-0 nylon.  I then directed attention to left chest. Inframammary scar incised an capsule entered. Intact implant removed. Patient demonstrated less lateral displacement on this side. "Popcorn" capsulorraphy completed over inferior capsule and lateral capsule. Interruped 0 vicryl suture used to reset inframammary fold between inferior capsule and chest wall.  Acellular dermis perforated and inset to chest wall with 2-0 PDS at desired anterior axillary line. The ADM was then redraped over prior implant and sutured to anterior capsule with 2-0 PDS. Additional plication suture placed with interrupted 2-0 PDS to chest wall and anterior mastectomy flap capsule. Total 100 cm2 ADM utilized for lateral border reinforcement bilateral. 15 Fr JP placed and secured with 2-0 nylon.  Stab incision made over bilateral lateral abdomen and tumescent fluid infiltrated over supra and infra umbilical abdomen,total 546 ml tumescent infiltrated. Power assisted liposuction performed to endpoint symmetric contour and soft tissue thickness. The fat was then washed and prepared by gravity for infiltration. Harvested fat was then infiltrated in subcutaneous and intramuscular planes throughout total envelope mastectomy flaps.   At this time breast cavities irrigated with solution containing Ancef, gentamicin, and bacitracin, followed by Betadine. Implant placed in right breast cavity. Care taken to ensure proper orientation. Closure was completed with running 3-0 vicryl for approximation of capsule  and superficial fascia. 4-0 vicryl was placed in dermis and running 4-0 monocryl  was used to close skin.Overleft breast following implant placement, closure completed with running 3-0 vicryl to approximate capsule and superficial fascia. 4-0 vicryl was placed in dermis and running 4-0 monocryl was used to close skin.  Tissue adhesive applied to breast incisions. The abdominal incisions were approximated with 4-0 monocryl. Dry dressing and abdominal and breast binders applied.   The patient was allowed to wake from anesthesia, extubated and taken to the recovery room in satisfactory condition.   SPECIMENS: none  DRAINS: 15 Fr JP in right and left breast reconstruction  Irene Limbo, MD [facility] [phone], pin [phone]

## 2016-09-23 NOTE — Anesthesia Preprocedure Evaluation (Addendum)
Anesthesia Evaluation  Patient identified by MRN, date of birth, ID band Patient awake    Reviewed: Allergy & Precautions, H&P , NPO status , Patient's Chart, lab work & pertinent test results  Airway Mallampati: II  TM Distance: >3 FB Neck ROM: Full    Dental no notable dental hx. (+) Teeth Intact, Dental Advisory Given   Pulmonary neg pulmonary ROS, former smoker,    Pulmonary exam normal breath sounds clear to auscultation       Cardiovascular negative cardio ROS   Rhythm:Regular Rate:Normal     Neuro/Psych Anxiety negative neurological ROS     GI/Hepatic negative GI ROS, Neg liver ROS,   Endo/Other  negative endocrine ROS  Renal/GU negative Renal ROS  negative genitourinary   Musculoskeletal   Abdominal   Peds  Hematology negative hematology ROS (+)   Anesthesia Other Findings   Reproductive/Obstetrics negative OB ROS                            Anesthesia Physical Anesthesia Plan  ASA: II  Anesthesia Plan: General   Post-op Pain Management:    Induction: Intravenous  Airway Management Planned: Oral ETT  Additional Equipment:   Intra-op Plan:   Post-operative Plan: Extubation in OR  Informed Consent: I have reviewed the patients History and Physical, chart, labs and discussed the procedure including the risks, benefits and alternatives for the proposed anesthesia with the patient or authorized representative who has indicated his/her understanding and acceptance.   Dental advisory given  Plan Discussed with: CRNA  Anesthesia Plan Comments:         Anesthesia Quick Evaluation

## 2016-09-23 NOTE — Anesthesia Postprocedure Evaluation (Signed)
Anesthesia Post Note  Patient: Erica Burch  Procedure(s) Performed: Procedure(s) (LRB): LIPOFILLING FROM ABDOMEN TO CHEST  BILATERAL (Bilateral) BILATERAL REVISION BREAST RECONSTRUCTION WITH SILICONE IMPLANT EXCHANGE; PLACEMENT ALLODERM (Bilateral)  Patient location during evaluation: PACU Anesthesia Type: General Level of consciousness: awake and alert Pain management: pain level controlled Vital Signs Assessment: post-procedure vital signs reviewed and stable Respiratory status: spontaneous breathing, nonlabored ventilation and respiratory function stable Cardiovascular status: blood pressure returned to baseline and stable Postop Assessment: no signs of nausea or vomiting Anesthetic complications: no       Last Vitals:  Vitals:   09/23/16 1115 09/23/16 1130  BP: 139/84 133/79  Pulse: 85 69  Resp: 13 14  Temp:      Last Pain:  Vitals:   09/23/16 1130  TempSrc:   PainSc: 3                  Lacheryl Niesen,W. EDMOND

## 2016-09-23 NOTE — Anesthesia Procedure Notes (Signed)
Procedure Name: Intubation Date/Time: 09/23/2016 7:47 AM Performed by: Marrianne Mood Pre-anesthesia Checklist: Patient identified, Emergency Drugs available, Suction available, Patient being monitored and Timeout performed Patient Re-evaluated:Patient Re-evaluated prior to inductionOxygen Delivery Method: Circle system utilized Preoxygenation: Pre-oxygenation with 100% oxygen Intubation Type: IV induction Ventilation: Mask ventilation without difficulty Laryngoscope Size: Miller and 3 Grade View: Grade III Tube type: Oral Tube size: 7.0 mm Number of attempts: 1 Airway Equipment and Method: Stylet and Oral airway Placement Confirmation: ETT inserted through vocal cords under direct vision,  positive ETCO2 and breath sounds checked- equal and bilateral Secured at: 20 cm Tube secured with: Tape Dental Injury: Teeth and Oropharynx as per pre-operative assessment

## 2016-09-23 NOTE — Interval H&P Note (Signed)
History and Physical Interval Note:  09/23/2016 7:02 AM  Erica Burch  has presented today for surgery, with the diagnosis of BRICA 1, ACQUIRED ABSENCE  BREAST  The various methods of treatment have been discussed with the patient and family. After consideration of risks, benefits and other options for treatment, the patient has consented to  Procedure(s): LIPOFILLING FROM ABDOMEN TO RIGHT CHEST POSSIBLE BILATERAL (Right) BILATERAL REVISION BREAST RECONSTRUCTION WITH SILICONE IMPLANT EXCHANGE PLACEMENT ALLODERM PLACMENT (Bilateral) SILICONE IMPLANTS EXCHANGE,ALLODERM PLACEMENT (Bilateral) as a surgical intervention .  The patient's history has been reviewed, patient examined, no change in status, stable for surgery.  I have reviewed the patient's chart and labs.  Questions were answered to the patient's satisfaction.     Bryttney Netzer

## 2016-09-23 NOTE — Discharge Instructions (Signed)
Post Anesthesia Home Care Instructions  Activity: Get plenty of rest for the remainder of the day. A responsible adult should stay with you for 24 hours following the procedure.  For the next 24 hours, DO NOT: -Drive a car -Paediatric nurse -Drink alcoholic beverages -Take any medication unless instructed by your physician -Make any legal decisions or sign important papers.  Meals: Start with liquid foods such as gelatin or soup. Progress to regular foods as tolerated. Avoid greasy, spicy, heavy foods. If nausea and/or vomiting occur, drink only clear liquids until the nausea and/or vomiting subsides. Call your physician if vomiting continues.  Special Instructions/Symptoms: Your throat may feel dry or sore from the anesthesia or the breathing tube placed in your throat during surgery. If this causes discomfort, gargle with warm salt water. The discomfort should disappear within 24 hours.  If you had a scopolamine patch placed behind your ear for the management of post- operative nausea and/or vomiting:  1. The medication in the patch is effective for 72 hours, after which it should be removed.  Wrap patch in a tissue and discard in the trash. Wash hands thoroughly with soap and water. 2. You may remove the patch earlier than 72 hours if you experience unpleasant side effects which may include dry mouth, dizziness or visual disturbances. 3. Avoid touching the patch. Wash your hands with soap and water after contact with the patch.       JP Drain [employer] this sheet to all of your post-operative appointments while you have your drains.  Please measure your drains by CC's or ML's.  Make sure you drain and measure your JP Drains 2 or 3 times per day.  At the end of each day, add up totals for the left side and add up totals for the right side.    ( 9 am )     ( 3 pm )        ( 9 pm )                Date L  R  L  R  L  R  Total L/R                                                                                                                                                                             Call your surgeon if you experience:   1.  Fever over 101.0. 2.  Inability to urinate. 3.  Nausea and/or vomiting. 4.  Extreme swelling or bruising at the surgical site. 5.  Continued bleeding from the incision. 6.  Increased pain, redness or drainage from the incision. 7.  Problems related to your pain medication. 8.  Any problems and/or concerns

## 2016-09-23 NOTE — Transfer of Care (Signed)
Immediate Anesthesia Transfer of Care Note  Patient: Erica Burch  Procedure(s) Performed: Procedure(s) with comments: LIPOFILLING FROM ABDOMEN TO CHEST  BILATERAL (Bilateral) BILATERAL REVISION BREAST RECONSTRUCTION WITH SILICONE IMPLANT EXCHANGE; PLACEMENT ALLODERM (Bilateral) - BILATERAL REVISION BREAST RECONSTRUCTION WITH SILICONE IMPLANT EXCHANGE PLACEMENT ALLODERM PLACEMENT  Patient Location: PACU  Anesthesia Type:General  Level of Consciousness: sedated  Airway & Oxygen Therapy: Patient Spontanous Breathing and Patient connected to face mask oxygen  Post-op Assessment: Report given to RN and Post -op Vital signs reviewed and stable  Post vital signs: Reviewed and stable  Last Vitals:  Vitals:   09/23/16 0703  BP: (!) 135/93  Pulse: 68  Resp: 20  Temp: 36.8 C    Last Pain:  Vitals:   09/23/16 0703  TempSrc: Oral         Complications: No apparent anesthesia complications

## 2016-09-24 ENCOUNTER — Encounter (HOSPITAL_BASED_OUTPATIENT_CLINIC_OR_DEPARTMENT_OTHER): Payer: Self-pay | Admitting: Plastic Surgery

## 2017-02-11 ENCOUNTER — Encounter: Payer: Self-pay | Admitting: Gynecology

## 2017-03-18 DIAGNOSIS — Z1501 Genetic susceptibility to malignant neoplasm of breast: Secondary | ICD-10-CM | POA: Diagnosis not present

## 2017-03-18 DIAGNOSIS — Z1509 Genetic susceptibility to other malignant neoplasm: Secondary | ICD-10-CM | POA: Diagnosis not present

## 2017-05-26 IMAGING — US US RT BREAST BX
1 series · 11 of 11 positions shown · non-contrast
Comparison: 12/10/2015, 11/29/2015, 11/27/2014

ADDENDUM:
Pathology revealed fibrocystic changes with apocrine metaplasia in
the right breast. This was found to be concordant by Dr. Nomasibulele
Linta. Pathology results were discussed with the patient by
telephone. The patient reported doing well after the biopsy with
tenderness at the site. Post biopsy instructions and care were
reviewed and questions were answered. The patient was encouraged to
call The [REDACTED] for any additional
concerns. The patient was instructed to return for annual screening
mammography and informed that a reminder letter would be sent
regarding this appointment. The patient is BRCA positive, therefore
bilateral screening MRI should be considered annually as part of her
screening process.

Pathology results reported by Margareth Ike RN, BSN on 12/11/2015.
CLINICAL DATA: 0.8 cm complex cystic mass right breast 12 o'clock
position. The patient is BRCA positive. Biopsy was recommended.
EXAM:
ULTRASOUND GUIDED RIGHT BREAST CORE NEEDLE BIOPSY

[Series 1: us right breast bx · 0.06mm/px · 11 of 11 slices shown]
[im 1/11]
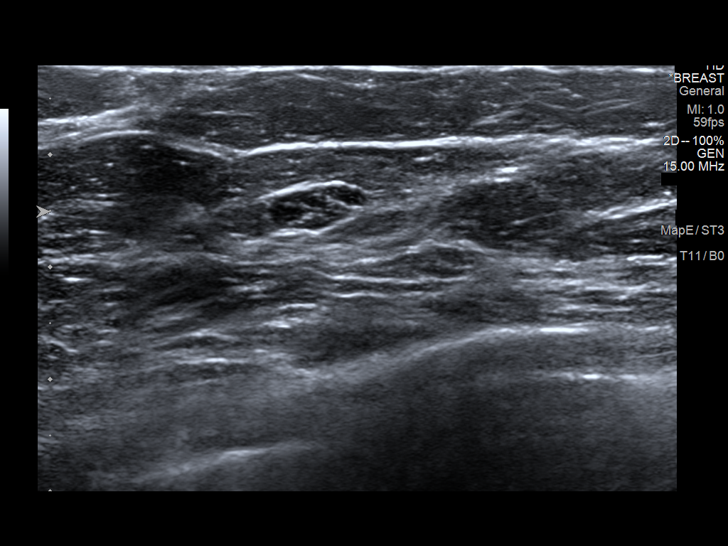
[im 2/11]
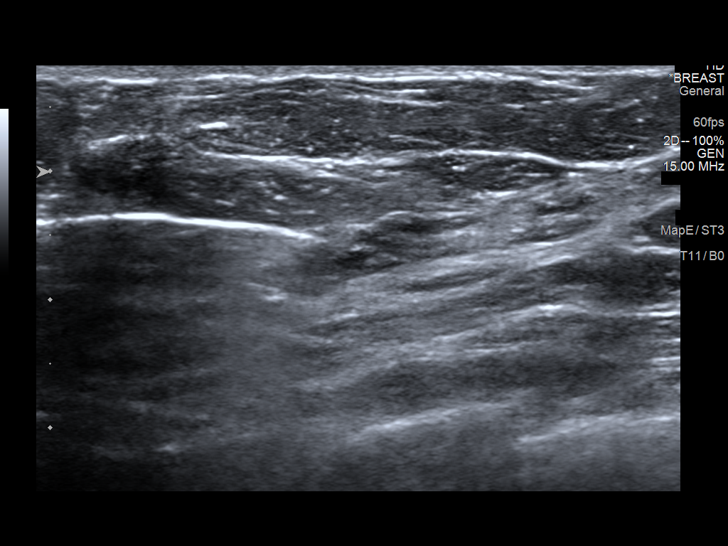
[im 3/11]
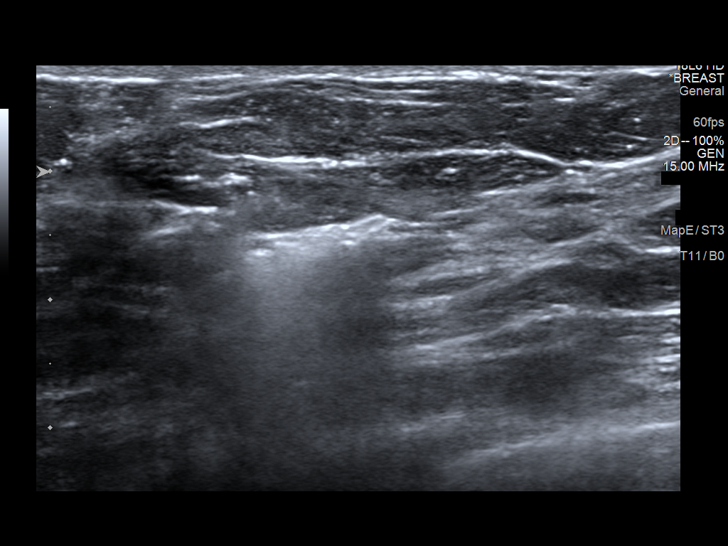
[im 4/11]
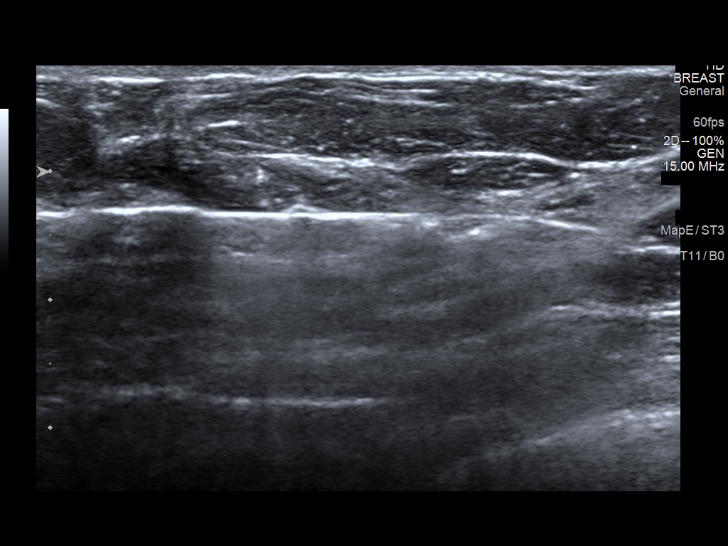
[im 5/11]
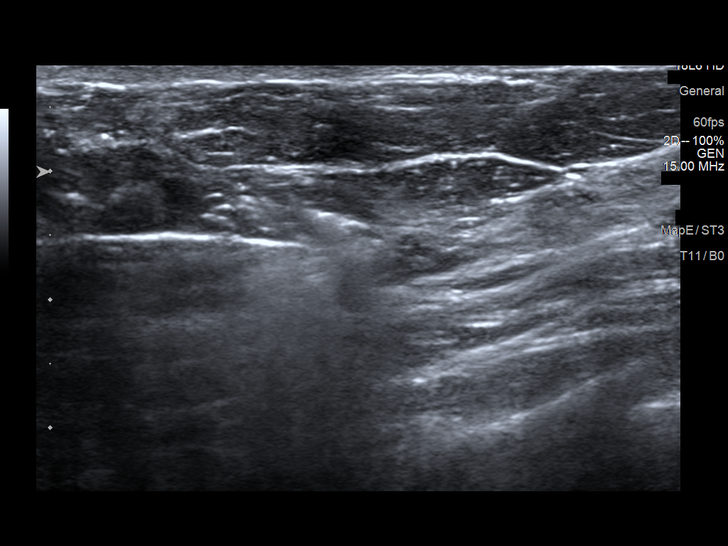
[im 6/11]
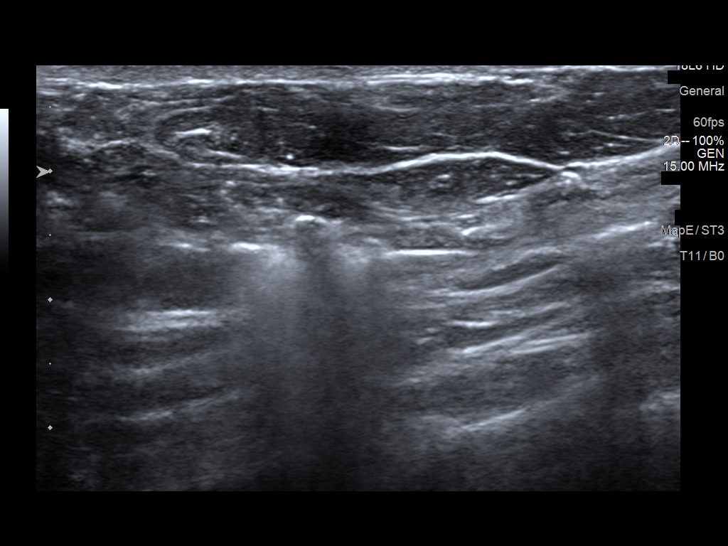
[im 7/11]
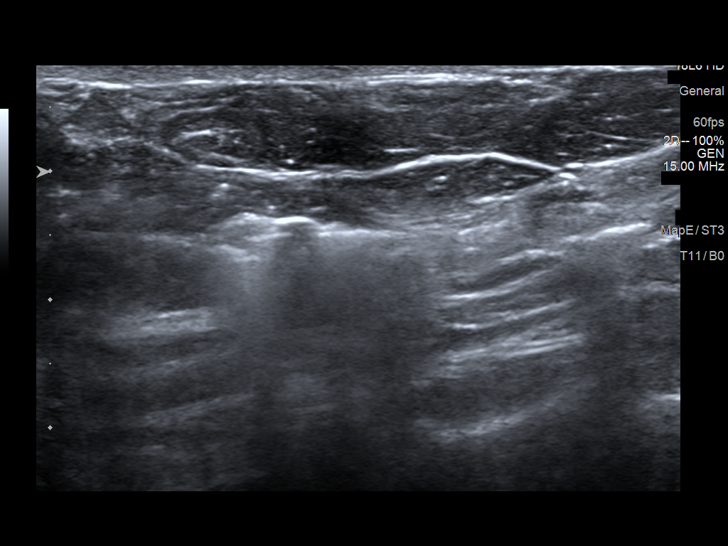
[im 8/11]
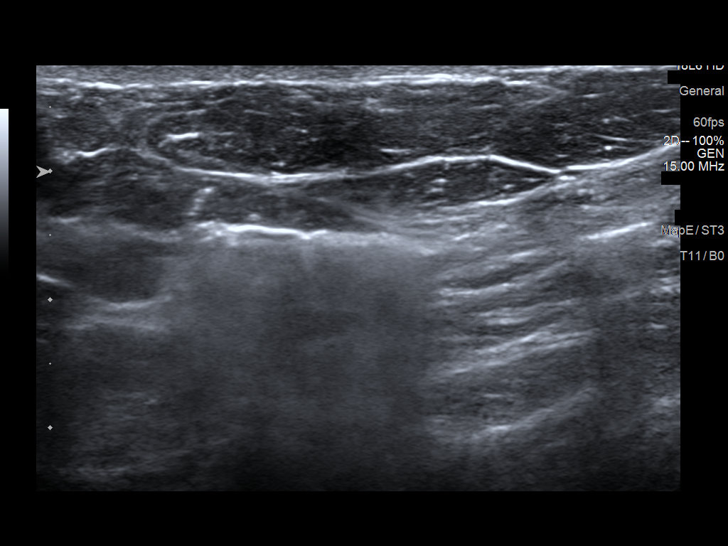
[im 9/11]
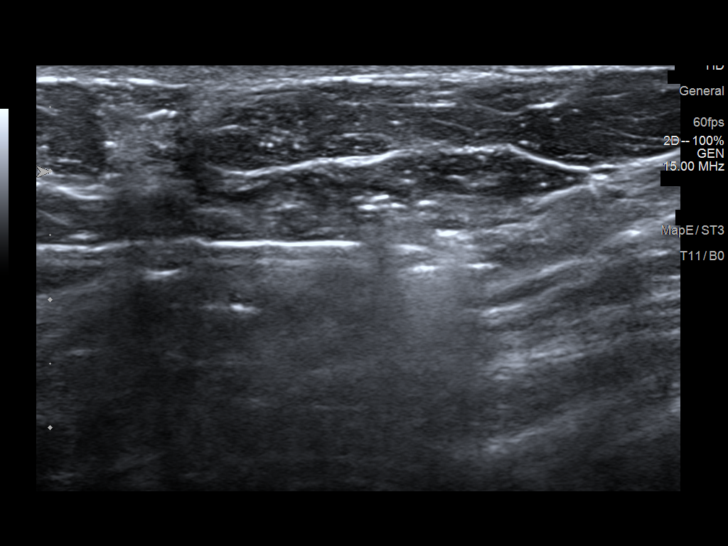
[im 10/11]
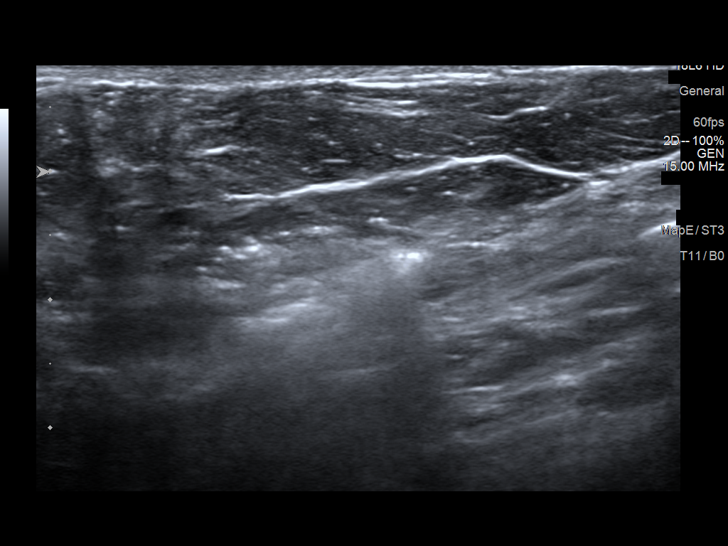
[im 11/11]
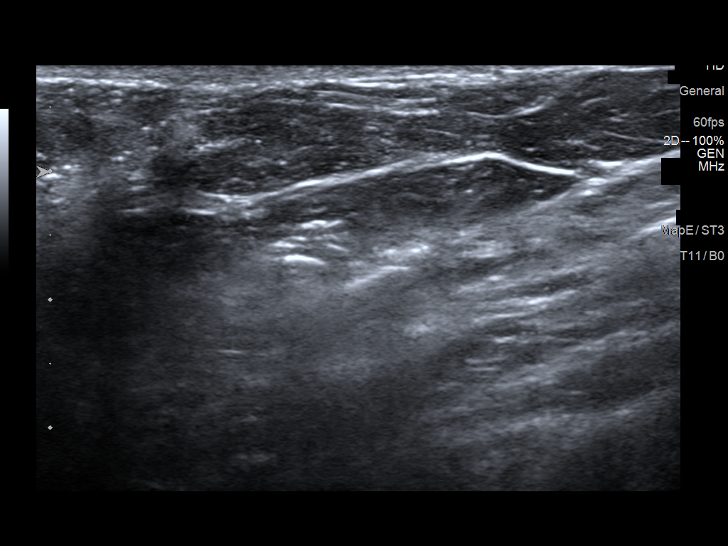

[11 of 11 positions shown; findings below may reference images not displayed]

PROCEDURE:
I met with the patient and we discussed the procedure of
ultrasound-guided biopsy, including benefits and alternatives. We
discussed the high likelihood of a successful procedure. We
discussed the risks of the procedure including infection, bleeding,
tissue injury, clip migration, and inadequate sampling. Informed
written consent was given. The usual time-out protocol was performed
immediately prior to the procedure.

Using sterile technique and 1% Lidocaine as local anesthetic, under
direct ultrasound visualization, a 12 gauge vacuum-assisted device
was used to perform biopsy of a 0.9 cm mass in the 12 o'clock
position of the right breastusing a lateral to medial approach. At
the conclusion of the procedure, a ribbon shaped tissue marker clip
was deployed into the biopsy cavity. Follow-up 2-view mammogram was
performed and dictated separately.
IMPRESSION: Ultrasound-guided biopsy of right breast. No apparent complications.

## 2017-05-26 IMAGING — MG MM DIAGNOSTIC UNILATERAL R
2 series · 2 of 2 positions shown · non-contrast
Comparison: 12/10/2015 and 11/29/2015

CLINICAL DATA: Ultrasound-guided biopsy was performed of the right
breast.

EXAM:
DIAGNOSTIC RIGHT MAMMOGRAM POST ULTRASOUND BIOPSY

[R CC]
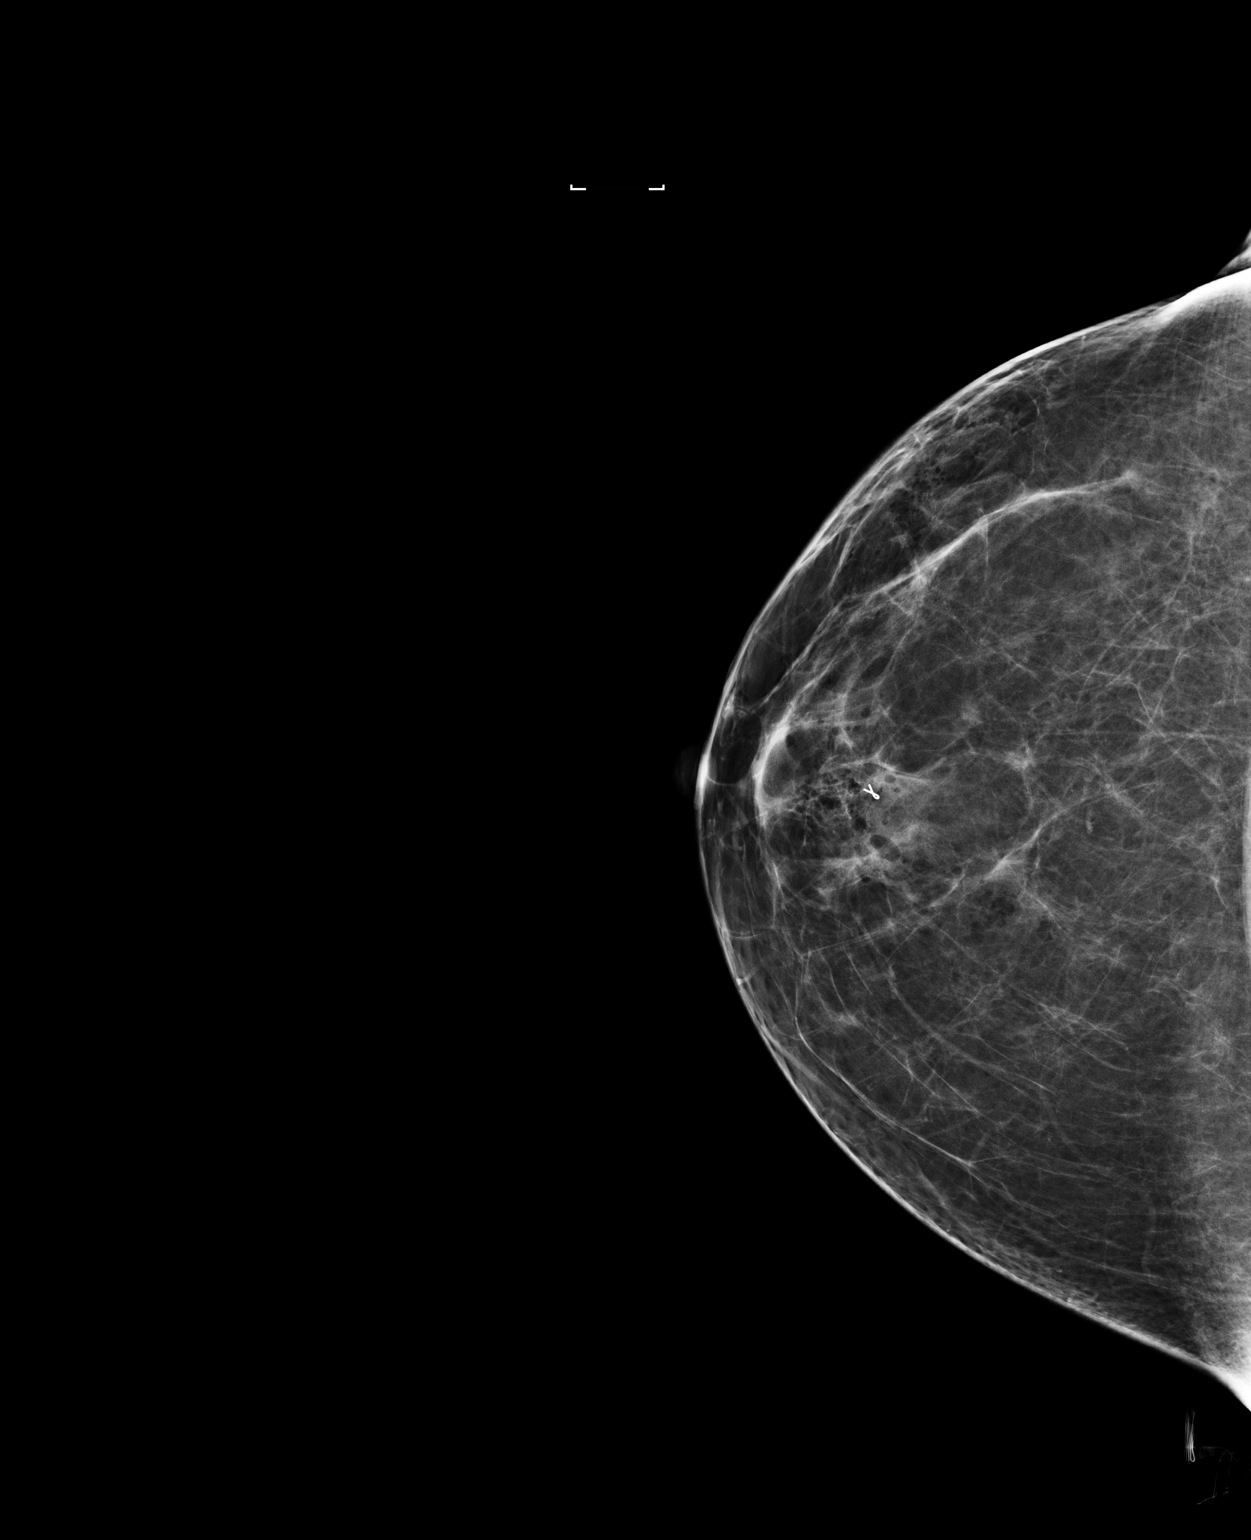

[R ML]
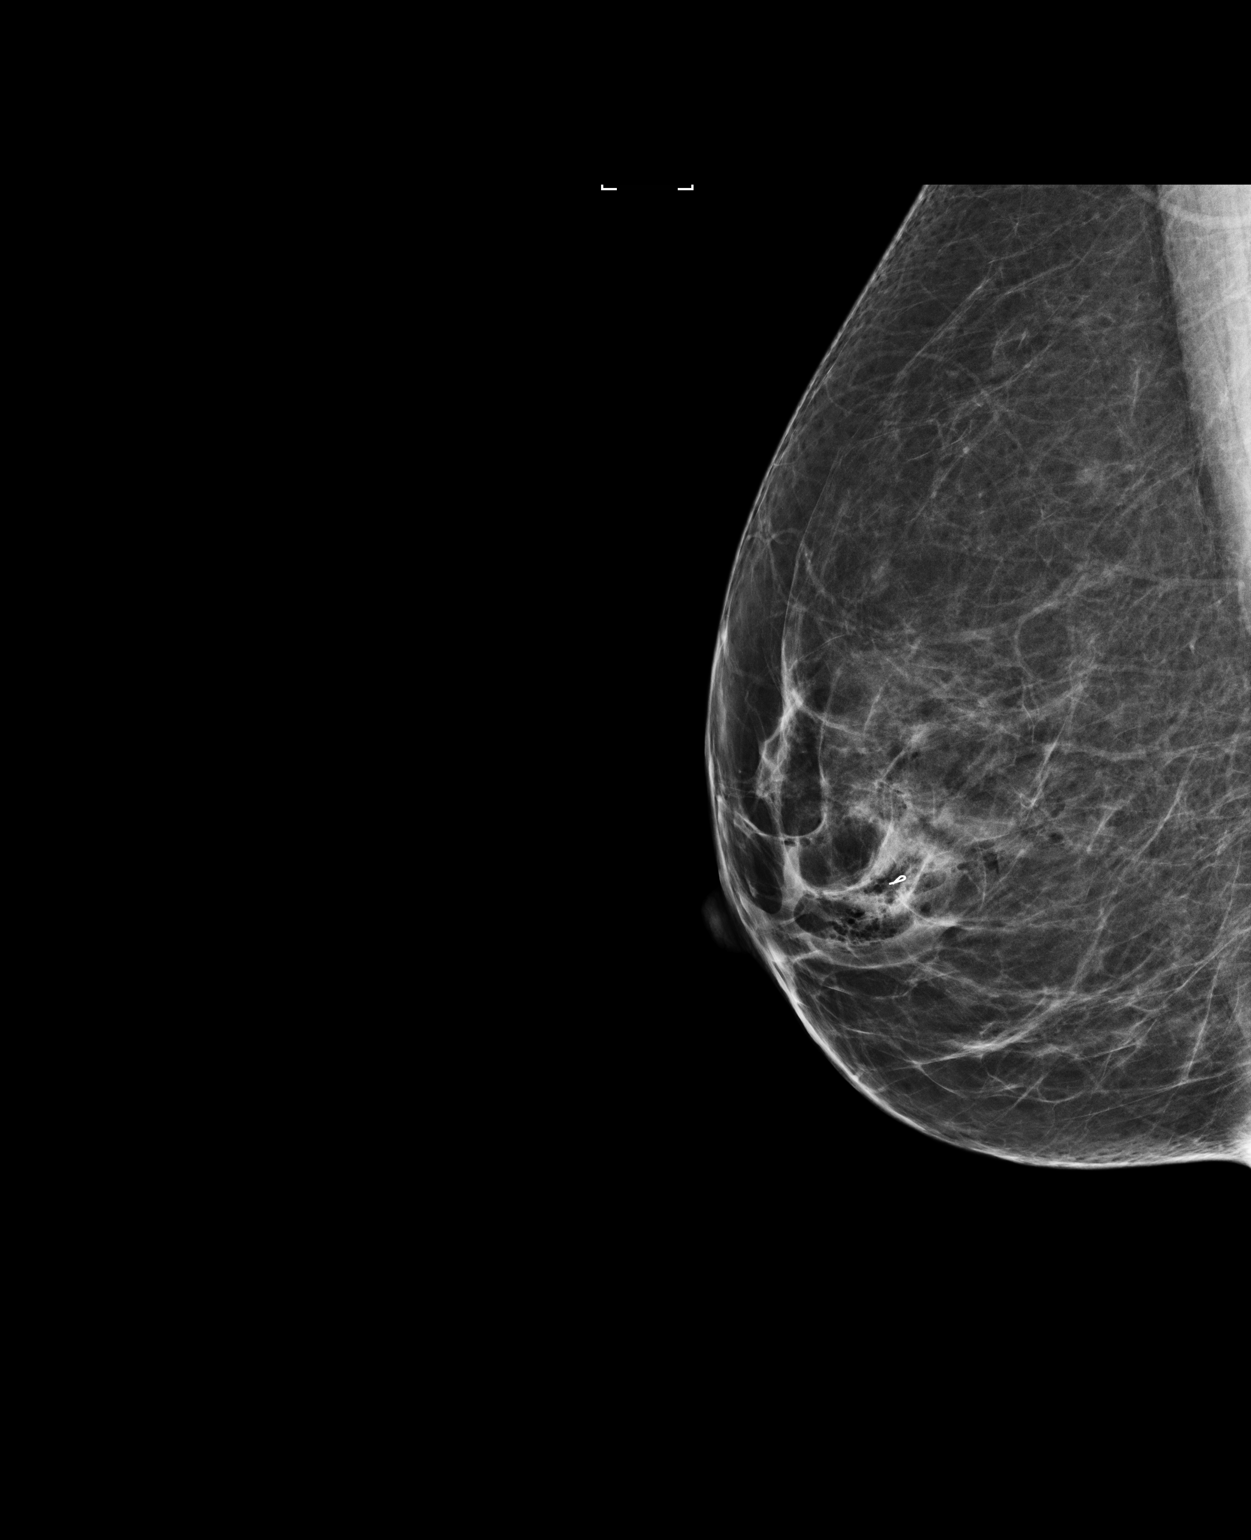

[2 of 2 positions shown; findings below may reference images not displayed]

FINDINGS: Mammographic images were obtained following ultrasound guided biopsy
of a cystic right breast mass at 12 o'clock position 1 cm from the
nipple. A ribbon shaped biopsy clip is satisfactorily positioned in
the 12 o'clock region of the right breast in the region of biopsy
change. The mass is not well visualized on the post biopsy images.
IMPRESSION: Ribbon shaped biopsy clip in satisfactory position in the region of
the biopsied mass.

Final Assessment: Post Procedure Mammograms for Marker Placement

## 2017-06-24 DIAGNOSIS — N200 Calculus of kidney: Secondary | ICD-10-CM | POA: Diagnosis not present

## 2017-07-21 IMAGING — MR MR BREAST BILAT WO/W CM
7 of 12 series · 27 of 48 positions shown · IV contrast (multihance)
Comparison: 05/17/2005 MR.  Recent mammograms and ultrasound.

CLINICAL DATA: 44-year-old BRCA gene positive female for screening
breast MRI. Recent benign right breast biopsy.

LABS:  None performed today
EXAM:
BILATERAL BREAST MRI WITH AND WITHOUT CONTRAST
TECHNIQUE: Multiplanar, multisequence MR images of both breasts were obtained
prior to and following the intravenous administration of 14 ml of
MultiHance.

[Series 2: STIR · axial · 3.0mm · 0.94mm/px · 1 of 62 slices shown]
[im 1/62]
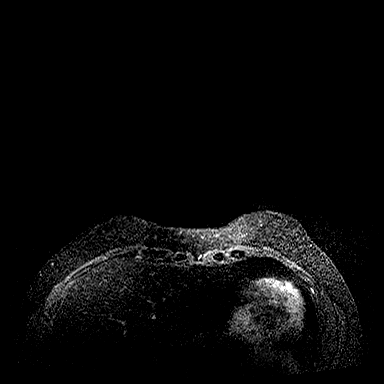

[Series 3: fl3d pre no · axial · non-contrast · 1.0mm · 0.80mm/px · z∈[-88,+87]mm · 4 of 176 slices shown]
[im 1/176]
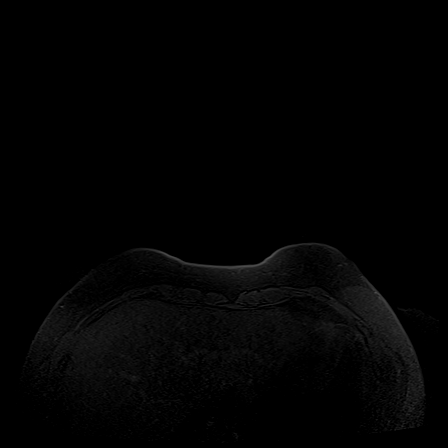
[im 59/176]
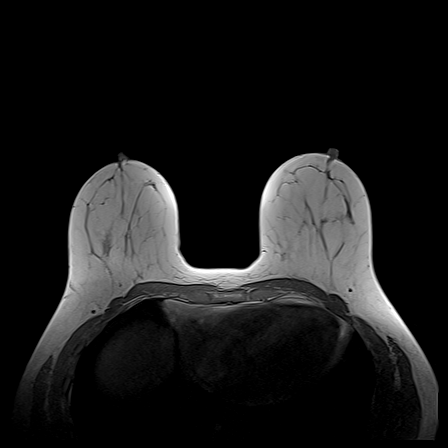
[im 117/176]
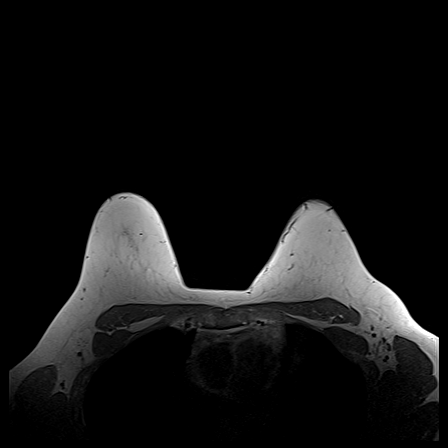
[im 176/176]
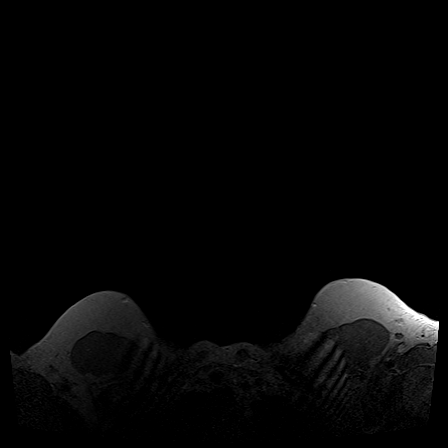

[Series 15: axial pre fs · axial · non-contrast · 1.0mm · 0.80mm/px · z∈[-107,+100]mm · 5 of 208 slices shown]
[im 1/208]
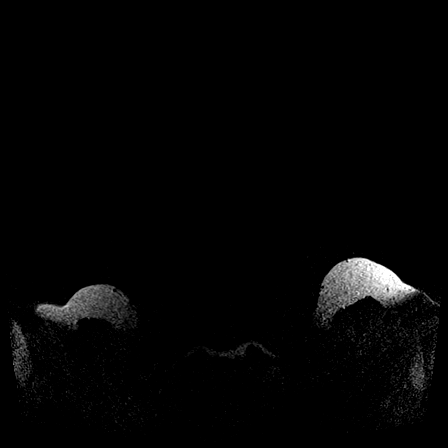
[im 52/208]
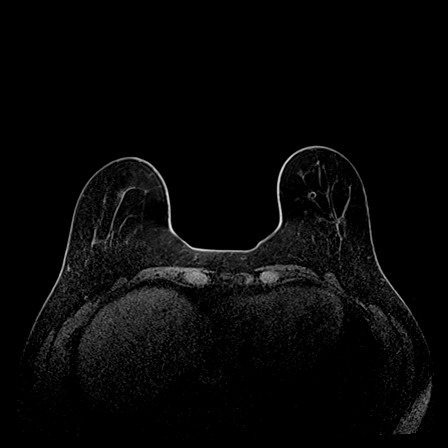
[im 104/208]
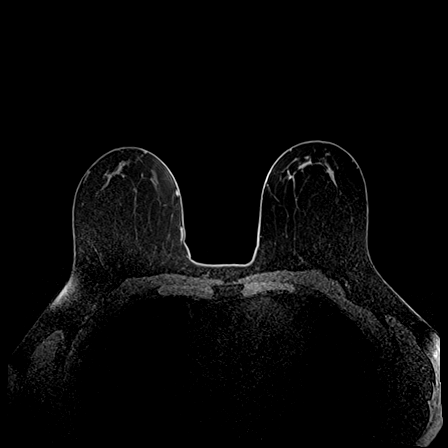
[im 156/208]
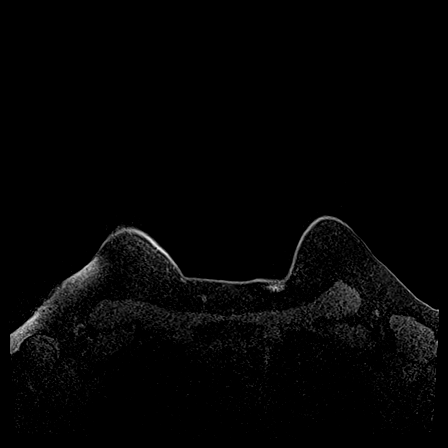
[im 208/208]
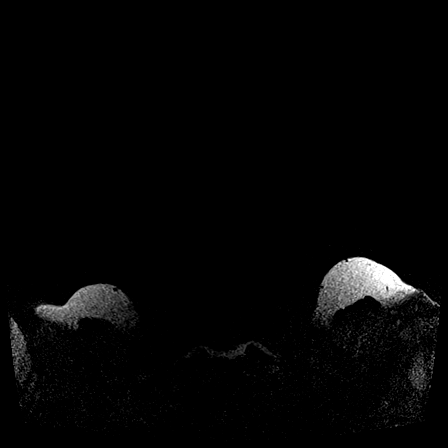

[Series 16: axial post 20 · axial · 1.0mm · 0.80mm/px · z∈[-107,+100]mm · 5 of 208 slices shown (1 of 3)]
[im 1/208]
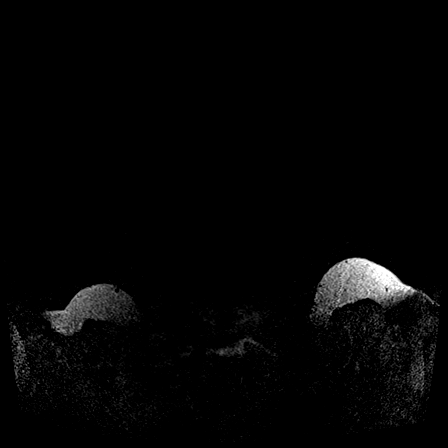
[im 52/208]
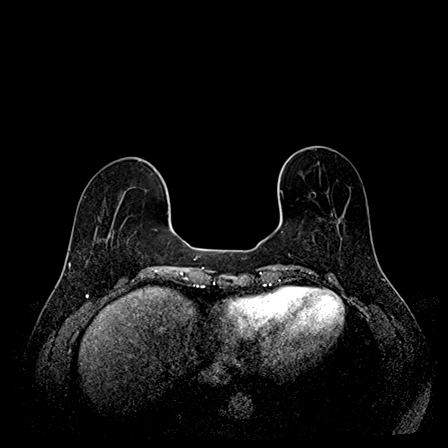
[im 104/208]
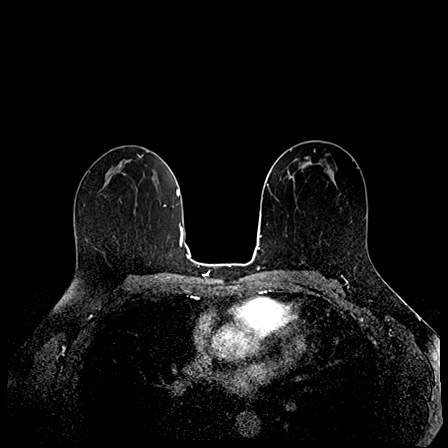
[im 156/208]
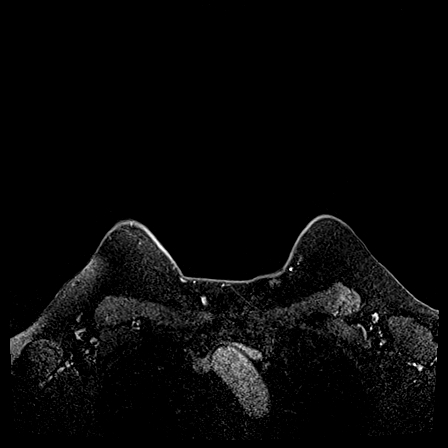
[im 208/208]
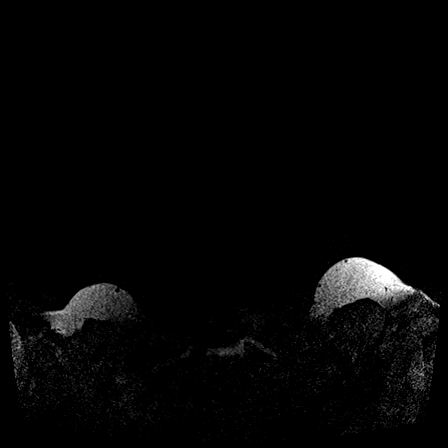

[Series 17: axial post 20 · axial · 1.0mm · 0.80mm/px · z∈[-107,+100]mm · 6 of 208 slices shown (2 of 3)]
[im 1/208]
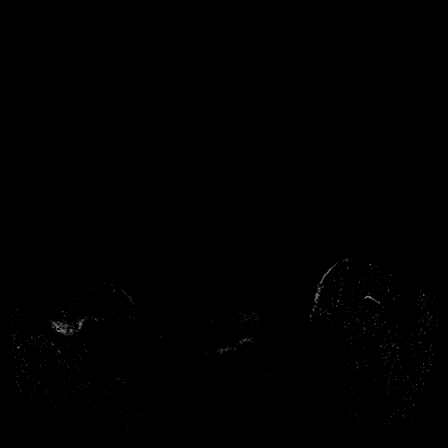
[im 42/208]
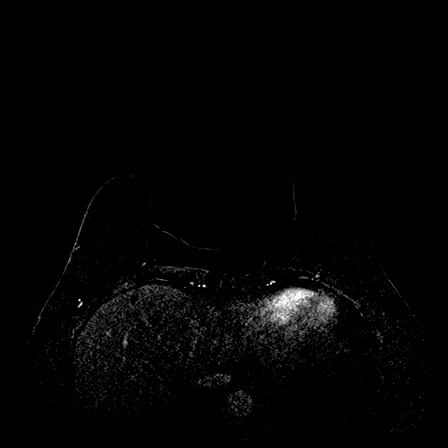
[im 83/208]
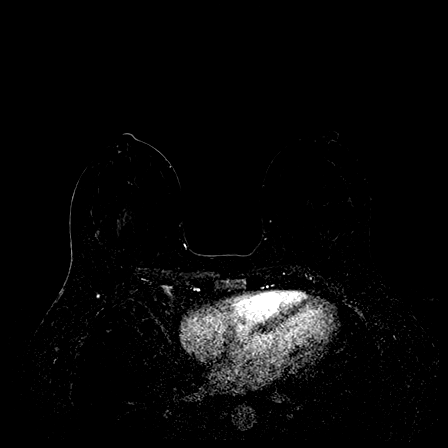
[im 125/208]
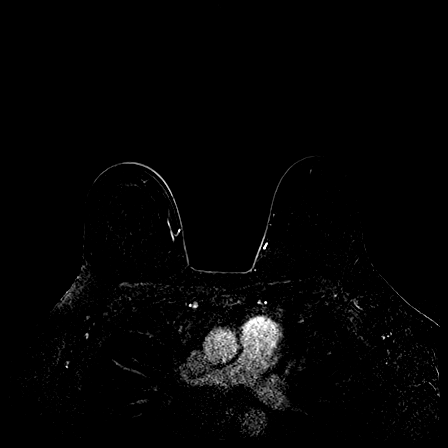
[im 166/208]
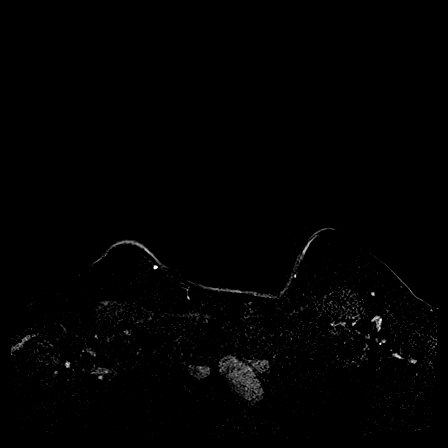
[im 208/208]
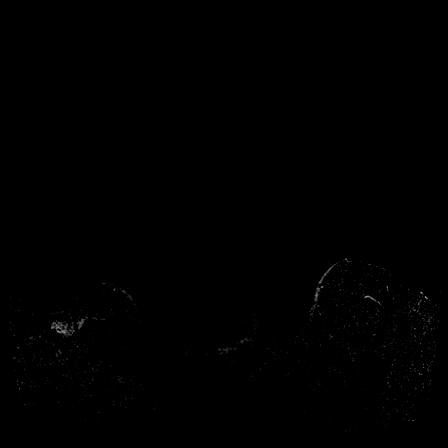

[Series 18: axial post 20 · axial · 208.0mm · 0.80mm/px · 1 of 1 slices shown (3 of 3)]
[im 1/1]
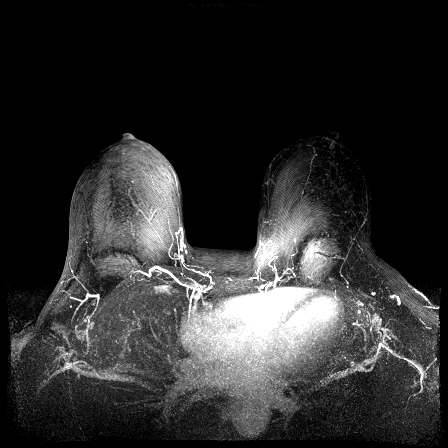

[Series 19: axial post 3 · axial · 1.0mm · 0.80mm/px · z∈[-107,+58]mm · 5 of 208 slices shown]
[im 1/208]
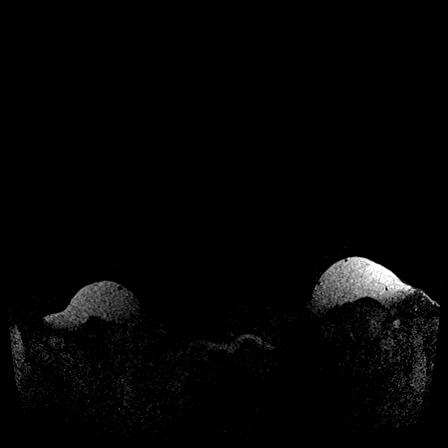
[im 42/208]
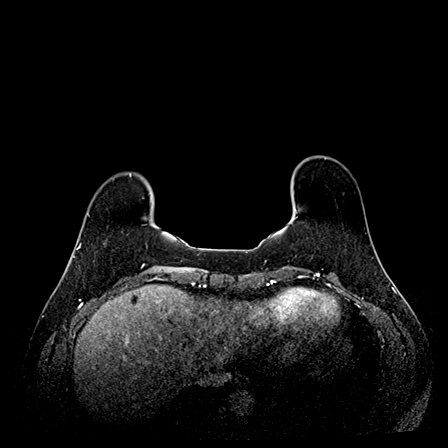
[im 83/208]
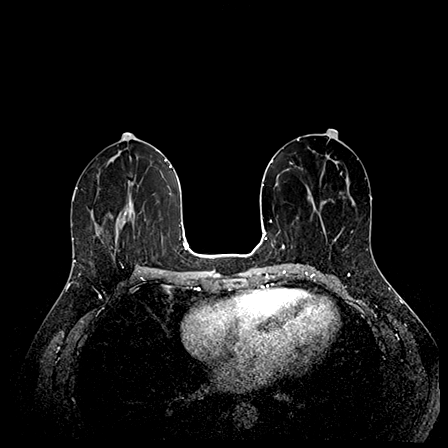
[im 125/208]
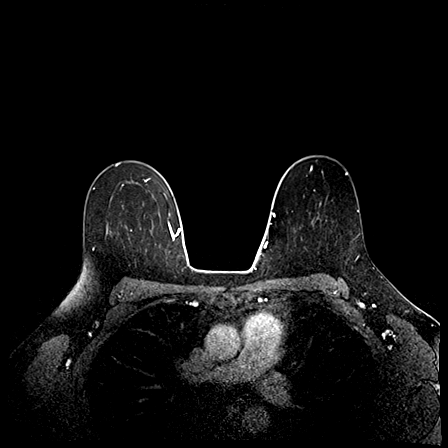
[im 166/208]
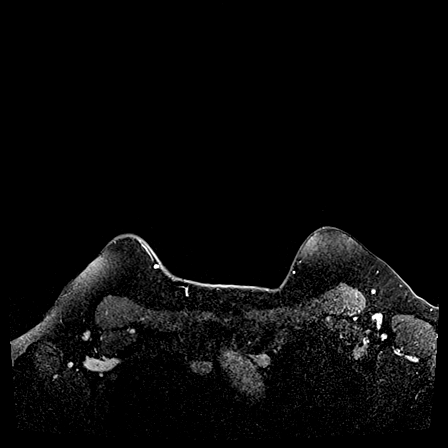

[27 of 48 positions shown; findings below may reference images not displayed]

THREE-DIMENSIONAL MR IMAGE RENDERING ON INDEPENDENT WORKSTATION:

Three-dimensional MR images were rendered by post-processing of the
original MR data on an independent workstation. The
three-dimensional MR images were interpreted, and findings are
reported in the following complete MRI report for this study. Three
dimensional images were evaluated at the independent [employer]
workstation
FINDINGS: Breast composition: b. Scattered fibroglandular tissue.

Background parenchymal enhancement: Minimal.

Right breast: No mass or abnormal enhancement. Biopsy clip artifact
within the anterior central right breast identified.

Left breast: No mass or abnormal enhancement.

Lymph nodes: No abnormal appearing lymph nodes.

Ancillary findings:  None.
IMPRESSION: No MR evidence of breast malignancy.

RECOMMENDATION:
Bilateral screening mammograms in 10 months to resume annual
mammogram schedule.

Bilateral screening breast MRI in 1 year.

BI-RADS CATEGORY  1: Negative.

## 2017-07-29 ENCOUNTER — Ambulatory Visit (INDEPENDENT_AMBULATORY_CARE_PROVIDER_SITE_OTHER): Payer: BLUE CROSS/BLUE SHIELD | Admitting: Obstetrics & Gynecology

## 2017-07-29 ENCOUNTER — Encounter: Payer: BLUE CROSS/BLUE SHIELD | Admitting: Gynecology

## 2017-07-29 ENCOUNTER — Encounter: Payer: Self-pay | Admitting: Obstetrics & Gynecology

## 2017-07-29 VITALS — BP 132/90 | Ht 64.0 in | Wt 164.0 lb

## 2017-07-29 DIAGNOSIS — Z01411 Encounter for gynecological examination (general) (routine) with abnormal findings: Secondary | ICD-10-CM | POA: Diagnosis not present

## 2017-07-29 DIAGNOSIS — Z1509 Genetic susceptibility to other malignant neoplasm: Secondary | ICD-10-CM | POA: Diagnosis not present

## 2017-07-29 DIAGNOSIS — Z23 Encounter for immunization: Secondary | ICD-10-CM

## 2017-07-29 DIAGNOSIS — F5101 Primary insomnia: Secondary | ICD-10-CM

## 2017-07-29 DIAGNOSIS — Z1501 Genetic susceptibility to malignant neoplasm of breast: Secondary | ICD-10-CM | POA: Diagnosis not present

## 2017-07-29 DIAGNOSIS — R87620 Atypical squamous cells of undetermined significance on cytologic smear of vagina (ASC-US): Secondary | ICD-10-CM | POA: Diagnosis not present

## 2017-07-29 DIAGNOSIS — E8940 Asymptomatic postprocedural ovarian failure: Secondary | ICD-10-CM | POA: Diagnosis not present

## 2017-07-29 MED ORDER — ZOLPIDEM TARTRATE 5 MG PO TABS: 5.0000 mg | ORAL_TABLET | Freq: Every evening | ORAL | 0 refills | Status: DC | PRN

## 2017-07-29 NOTE — Addendum Note (Signed)
Addended by: Thurnell Garbe A on: 07/29/2017 11:18 AM   Modules accepted: Orders

## 2017-07-29 NOTE — Patient Instructions (Signed)
1. Encounter for gynecological examination with abnormal finding Gynecologic exam status post hysterectomy.  Pap reflex done today.  Status post bilateral mastectomy in 2017. - CBC - Comp Met (CMET) - Lipid panel - TSH - Vitamin D 1,25 dihydroxy  2. Surgical menopause No hormone replacement therapy.  Well-tolerated. - DG Bone Density; Future  3. BRCA1 gene mutation positive Status post total abdominal hysterectomy with bilateral salpingo-oophorectomy in 2012.  Status post bilateral mastectomy in 2017.  4. Primary insomnia Recommend increase physical activity.  Herb tea like Camomile recommended.  Ambien 5 mg p.o. at bedtime as needed prescribed.  Encouraged to use sparingly.  Erica Burch, it was a pleasure meeting you today!  I will inform you of your results as soon as available.   Health Maintenance for Postmenopausal Women Menopause is a normal process in which your reproductive ability comes to an end. This process happens gradually over a span of months to years, usually between the ages of 41 and 85. Menopause is complete when you have missed 12 consecutive menstrual periods. It is important to talk with your health care provider about some of the most common conditions that affect postmenopausal women, such as heart disease, cancer, and bone loss (osteoporosis). Adopting a healthy lifestyle and getting preventive care can help to promote your health and wellness. Those actions can also lower your chances of developing some of these common conditions. What should I know about menopause? During menopause, you may experience a number of symptoms, such as:  Moderate-to-severe hot flashes.  Night sweats.  Decrease in sex drive.  Mood swings.  Headaches.  Tiredness.  Irritability.  Memory problems.  Insomnia.  Choosing to treat or not to treat menopausal changes is an individual decision that you make with your health care provider. What should I know about hormone  replacement therapy and supplements? Hormone therapy products are effective for treating symptoms that are associated with menopause, such as hot flashes and night sweats. Hormone replacement carries certain risks, especially as you become older. If you are thinking about using estrogen or estrogen with progestin treatments, discuss the benefits and risks with your health care provider. What should I know about heart disease and stroke? Heart disease, heart attack, and stroke become more likely as you age. This may be due, in part, to the hormonal changes that your body experiences during menopause. These can affect how your body processes dietary fats, triglycerides, and cholesterol. Heart attack and stroke are both medical emergencies. There are many things that you can do to help prevent heart disease and stroke:  Have your blood pressure checked at least every 1-2 years. High blood pressure causes heart disease and increases the risk of stroke.  If you are 2-67 years old, ask your health care provider if you should take aspirin to prevent a heart attack or a stroke.  Do not use any tobacco products, including cigarettes, chewing tobacco, or electronic cigarettes. If you need help quitting, ask your health care provider.  It is important to eat a healthy diet and maintain a healthy weight. ? Be sure to include plenty of vegetables, fruits, low-fat dairy products, and lean protein. ? Avoid eating foods that are high in solid fats, added sugars, or salt (sodium).  Get regular exercise. This is one of the most important things that you can do for your health. ? Try to exercise for at least 150 minutes each week. The type of exercise that you do should increase your heart rate and make  you sweat. This is known as moderate-intensity exercise. ? Try to do strengthening exercises at least twice each week. Do these in addition to the moderate-intensity exercise.  Know your numbers.Ask your health  care provider to check your cholesterol and your blood glucose. Continue to have your blood tested as directed by your health care provider.  What should I know about cancer screening? There are several types of cancer. Take the following steps to reduce your risk and to catch any cancer development as early as possible. Breast Cancer  Practice breast self-awareness. ? This means understanding how your breasts normally appear and feel. ? It also means doing regular breast self-exams. Let your health care provider know about any changes, no matter how small.  If you are 55 or older, have a clinician do a breast exam (clinical breast exam or CBE) every year. Depending on your age, family history, and medical history, it may be recommended that you also have a yearly breast X-ray (mammogram).  If you have a family history of breast cancer, talk with your health care provider about genetic screening.  If you are at high risk for breast cancer, talk with your health care provider about having an MRI and a mammogram every year.  Breast cancer (BRCA) gene test is recommended for women who have family members with BRCA-related cancers. Results of the assessment will determine the need for genetic counseling and BRCA1 and for BRCA2 testing. BRCA-related cancers include these types: ? Breast. This occurs in males or females. ? Ovarian. ? Tubal. This may also be called fallopian tube cancer. ? Cancer of the abdominal or pelvic lining (peritoneal cancer). ? Prostate. ? Pancreatic.  Cervical, Uterine, and Ovarian Cancer Your health care provider may recommend that you be screened regularly for cancer of the pelvic organs. These include your ovaries, uterus, and vagina. This screening involves a pelvic exam, which includes checking for microscopic changes to the surface of your cervix (Pap test).  For women ages 21-65, health care providers may recommend a pelvic exam and a Pap test every three years.  For women ages 1-65, they may recommend the Pap test and pelvic exam, combined with testing for human papilloma virus (HPV), every five years. Some types of HPV increase your risk of cervical cancer. Testing for HPV may also be done on women of any age who have unclear Pap test results.  Other health care providers may not recommend any screening for nonpregnant women who are considered low risk for pelvic cancer and have no symptoms. Ask your health care provider if a screening pelvic exam is right for you.  If you have had past treatment for cervical cancer or a condition that could lead to cancer, you need Pap tests and screening for cancer for at least 20 years after your treatment. If Pap tests have been discontinued for you, your risk factors (such as having a new sexual partner) need to be reassessed to determine if you should start having screenings again. Some women have medical problems that increase the chance of getting cervical cancer. In these cases, your health care provider may recommend that you have screening and Pap tests more often.  If you have a family history of uterine cancer or ovarian cancer, talk with your health care provider about genetic screening.  If you have vaginal bleeding after reaching menopause, tell your health care provider.  There are currently no reliable tests available to screen for ovarian cancer.  Lung Cancer Lung cancer screening  is recommended for adults 19-65 years old who are at high risk for lung cancer because of a history of smoking. A yearly low-dose CT scan of the lungs is recommended if you:  Currently smoke.  Have a history of at least 30 pack-years of smoking and you currently smoke or have quit within the past 15 years. A pack-year is smoking an average of one pack of cigarettes per day for one year.  Yearly screening should:  Continue until it has been 15 years since you quit.  Stop if you develop a health problem that would prevent  you from having lung cancer treatment.  Colorectal Cancer  This type of cancer can be detected and can often be prevented.  Routine colorectal cancer screening usually begins at age 50 and continues through age 36.  If you have risk factors for colon cancer, your health care provider may recommend that you be screened at an earlier age.  If you have a family history of colorectal cancer, talk with your health care provider about genetic screening.  Your health care provider may also recommend using home test kits to check for hidden blood in your stool.  A small camera at the end of a tube can be used to examine your colon directly (sigmoidoscopy or colonoscopy). This is done to check for the earliest forms of colorectal cancer.  Direct examination of the colon should be repeated every 5-10 years until age 62. However, if early forms of precancerous polyps or small growths are found or if you have a family history or genetic risk for colorectal cancer, you may need to be screened more often.  Skin Cancer  Check your skin from head to toe regularly.  Monitor any moles. Be sure to tell your health care provider: ? About any new moles or changes in moles, especially if there is a change in a mole's shape or color. ? If you have a mole that is larger than the size of a pencil eraser.  If any of your family members has a history of skin cancer, especially at a young age, talk with your health care provider about genetic screening.  Always use sunscreen. Apply sunscreen liberally and repeatedly throughout the day.  Whenever you are outside, protect yourself by wearing long sleeves, pants, a wide-brimmed hat, and sunglasses.  What should I know about osteoporosis? Osteoporosis is a condition in which bone destruction happens more quickly than new bone creation. After menopause, you may be at an increased risk for osteoporosis. To help prevent osteoporosis or the bone fractures that can  happen because of osteoporosis, the following is recommended:  If you are 29-66 years old, get at least 1,000 mg of calcium and at least 600 mg of vitamin D per day.  If you are older than age 110 but younger than age 70, get at least 1,200 mg of calcium and at least 600 mg of vitamin D per day.  If you are older than age 55, get at least 1,200 mg of calcium and at least 800 mg of vitamin D per day.  Smoking and excessive alcohol intake increase the risk of osteoporosis. Eat foods that are rich in calcium and vitamin D, and do weight-bearing exercises several times each week as directed by your health care provider. What should I know about how menopause affects my mental health? Depression may occur at any age, but it is more common as you become older. Common symptoms of depression include:  Low or  sad mood.  Changes in sleep patterns.  Changes in appetite or eating patterns.  Feeling an overall lack of motivation or enjoyment of activities that you previously enjoyed.  Frequent crying spells.  Talk with your health care provider if you think that you are experiencing depression. What should I know about immunizations? It is important that you get and maintain your immunizations. These include:  Tetanus, diphtheria, and pertussis (Tdap) booster vaccine.  Influenza every year before the flu season begins.  Pneumonia vaccine.  Shingles vaccine.  Your health care provider may also recommend other immunizations. This information is not intended to replace advice given to you by your health care provider. Make sure you discuss any questions you have with your health care provider. Document Released: 11/07/2005 Document Revised: 04/04/2016 Document Reviewed: 06/19/2015 Elsevier Interactive Patient Education  2018 [employer].

## 2017-07-29 NOTE — Progress Notes (Signed)
Erica Burch [birth year] [id number]   History:    46 y.o.  G3P3 Remarried.  Daughters 13-16-26 yo.  1 step daughter 76 yo.  RP:  Established patient presenting for annual gyn exam   HPI:  BrCa 1 pos.  S/P TAH/BSO in 2012.  Bilateral Mastectomy 2017 with reconstruction.  No HRT.  Very mild Sxs.  No pelvic pain.  Bone Density normal 08/2015.  Vit D/Ca++ supplements.  Mictions/BMs wnl.  Past medical history,surgical history, family history and social history were all reviewed and documented in the EPIC chart.  Gynecologic History Patient's last menstrual period was 04/28/2009. Contraception: status post hysterectomy Last Pap: 2012. Results were: normal Last mammogram: 2017. Results were: normal Bone density normal 2016  Obstetric History OB History  Gravida Para Term Preterm AB Living  3 3 3         SAB TAB Ectopic Multiple Live Births               # Outcome Date GA Lbr Len/2nd Weight Sex Delivery Anes PTL Lv  3 Term     M Vag-Spont     2 Term     F Vag-Spont     1 Term     F Vag-Spont          ROS: A ROS was performed and pertinent positives and negatives are included in the history.  GENERAL: No fevers or chills. HEENT: No change in vision, no earache, sore throat or sinus congestion. NECK: No pain or stiffness. CARDIOVASCULAR: No chest pain or pressure. No palpitations. PULMONARY: No shortness of breath, cough or wheeze. GASTROINTESTINAL: No abdominal pain, nausea, vomiting or diarrhea, melena or bright red blood per rectum. GENITOURINARY: No urinary frequency, urgency, hesitancy or dysuria. MUSCULOSKELETAL: No joint or muscle pain, no back pain, no recent trauma. DERMATOLOGIC: No rash, no itching, no lesions. ENDOCRINE: No polyuria, polydipsia, no heat or cold intolerance. No recent change in weight. HEMATOLOGICAL: No anemia or easy bruising or bleeding. NEUROLOGIC: No headache, seizures, numbness, tingling or weakness. PSYCHIATRIC: No depression, no loss of interest in  normal activity or change in sleep pattern.     Exam:   BP 132/90   Ht 5' 4"  (1.626 m)   Wt 164 lb (74.4 kg)   LMP 04/28/2009   BMI 28.15 kg/m   Body mass index is 28.15 kg/m.  General appearance : Well developed well nourished female. No acute distress HEENT: Eyes: no retinal hemorrhage or exudates,  Neck supple, trachea midline, no carotid bruits, no thyroidmegaly Lungs: Clear to auscultation, no rhonchi or wheezes, or rib retractions  Heart: Regular rate and rhythm, no murmurs or gallops Breast:Examined in sitting and supine position were symmetrical in appearance, no palpable masses or tenderness,  no skin retraction, no nipple inversion, no nipple discharge, no skin discoloration, no axillary or supraclavicular lymphadenopathy Abdomen: no palpable masses or tenderness, no rebound or guarding Extremities: no edema or skin discoloration or tenderness  Pelvic: Vulva normal  Bartholin, Urethra, Skene Glands: Within normal limits             Vagina: No gross lesions or discharge.  Pap reflex done.  Cervix/Uterus absent.  Adnexa  Without masses or tenderness  Anus and perineum  normal    Assessment/Plan:  46 y.o. female for annual exam   1. Encounter for gynecological examination with abnormal finding Gynecologic exam status post hysterectomy.  Pap reflex done today.  Status post bilateral mastectomy in 2017. - CBC - Comp Met (  CMET) - Lipid panel - TSH - Vitamin D 1,25 dihydroxy  2. Surgical menopause No hormone replacement therapy.  Well-tolerated. - DG Bone Density; Future  3. BRCA1 gene mutation positive Status post total abdominal hysterectomy with bilateral salpingo-oophorectomy in 2012.  Status post bilateral mastectomy in 2017.  4. Primary insomnia Recommend increase physical activity.  Herb tea like Camomile recommended.  Ambien 5 mg p.o. at bedtime as needed prescribed.  Encouraged to use sparingly.  Counseling on above described issues more than 50% for 10  minutes.  Princess Bruins MD, 9:53 AM 07/29/2017

## 2017-08-03 LAB — LIPID PANEL
CHOLESTEROL: 169 mg/dL (ref <200)
HDL: 47 mg/dL — AB (ref >50)
LDL CHOLESTEROL (CALC): 101 mg/dL — AB
Non-HDL Cholesterol (Calc): 122 mg/dL (calc) (ref <130)
TRIGLYCERIDES: 114 mg/dL (ref <150)
Total CHOL/HDL Ratio: 3.6 (calc) (ref <5.0)

## 2017-08-03 LAB — CBC
HCT: 37.4 % (ref 35.0–45.0)
Hemoglobin: 12.9 g/dL (ref 11.7–15.5)
MCH: 28.9 pg (ref 27.0–33.0)
MCHC: 34.5 g/dL (ref 32.0–36.0)
MCV: 83.9 fL (ref 80.0–100.0)
MPV: 10.3 fL (ref 7.5–12.5)
Platelets: 270 10*3/uL (ref 140–400)
RBC: 4.46 10*6/uL (ref 3.80–5.10)
RDW: 13 % (ref 11.0–15.0)
WBC: 4.2 10*3/uL (ref 3.8–10.8)

## 2017-08-03 LAB — PAP IG W/ RFLX HPV ASCU

## 2017-08-03 LAB — COMPREHENSIVE METABOLIC PANEL
AG Ratio: 1.8 (calc) (ref 1.0–2.5)
ALBUMIN MSPROF: 4.4 g/dL (ref 3.6–5.1)
ALKALINE PHOSPHATASE (APISO): 81 U/L (ref 33–115)
ALT: 12 U/L (ref 6–29)
AST: 16 U/L (ref 10–35)
BILIRUBIN TOTAL: 0.8 mg/dL (ref 0.2–1.2)
BUN: 11 mg/dL (ref 7–25)
CALCIUM: 9.3 mg/dL (ref 8.6–10.2)
CO2: 27 mmol/L (ref 20–32)
Chloride: 106 mmol/L (ref 98–110)
Creat: 0.84 mg/dL (ref 0.50–1.10)
Globulin: 2.5 g/dL (calc) (ref 1.9–3.7)
Glucose, Bld: 93 mg/dL (ref 65–99)
Potassium: 4 mmol/L (ref 3.5–5.3)
SODIUM: 140 mmol/L (ref 135–146)
Total Protein: 6.9 g/dL (ref 6.1–8.1)

## 2017-08-03 LAB — VITAMIN D 1,25 DIHYDROXY
VITAMIN D3 1, 25 (OH): 58 pg/mL
Vitamin D 1, 25 (OH)2 Total: 58 pg/mL (ref 18–72)

## 2017-08-03 LAB — TSH: TSH: 4.26 mIU/L

## 2017-08-03 LAB — HUMAN PAPILLOMAVIRUS, HIGH RISK: HPV DNA HIGH RISK: NOT DETECTED

## 2017-09-25 ENCOUNTER — Encounter: Payer: Self-pay | Admitting: Obstetrics & Gynecology

## 2017-09-25 MED ORDER — ESZOPICLONE 1 MG PO TABS: 1.0000 mg | ORAL_TABLET | Freq: Every evening | ORAL | 5 refills | Status: DC | PRN

## 2017-09-25 NOTE — Telephone Encounter (Signed)
Agree with lowest dose of Lunesta generic to be sent. 1 tab at bedtime, #30, refill x 5.

## 2017-09-28 ENCOUNTER — Other Ambulatory Visit: Payer: Self-pay | Admitting: Obstetrics & Gynecology

## 2017-09-28 ENCOUNTER — Other Ambulatory Visit: Payer: Self-pay | Admitting: Gynecology

## 2017-09-28 DIAGNOSIS — Z13820 Encounter for screening for osteoporosis: Secondary | ICD-10-CM

## 2017-09-28 DIAGNOSIS — E8940 Asymptomatic postprocedural ovarian failure: Secondary | ICD-10-CM

## 2017-10-05 DIAGNOSIS — Z9013 Acquired absence of bilateral breasts and nipples: Secondary | ICD-10-CM | POA: Diagnosis not present

## 2017-10-05 DIAGNOSIS — Z1501 Genetic susceptibility to malignant neoplasm of breast: Secondary | ICD-10-CM | POA: Diagnosis not present

## 2017-10-05 DIAGNOSIS — Z1509 Genetic susceptibility to other malignant neoplasm: Secondary | ICD-10-CM | POA: Diagnosis not present

## 2017-10-06 ENCOUNTER — Encounter: Payer: Self-pay | Admitting: Gynecology

## 2017-10-06 ENCOUNTER — Ambulatory Visit (INDEPENDENT_AMBULATORY_CARE_PROVIDER_SITE_OTHER): Payer: BLUE CROSS/BLUE SHIELD

## 2017-10-06 DIAGNOSIS — Z13820 Encounter for screening for osteoporosis: Secondary | ICD-10-CM | POA: Diagnosis not present

## 2017-10-06 DIAGNOSIS — E8940 Asymptomatic postprocedural ovarian failure: Secondary | ICD-10-CM

## 2018-05-06 DIAGNOSIS — Z1501 Genetic susceptibility to malignant neoplasm of breast: Secondary | ICD-10-CM | POA: Diagnosis not present

## 2018-05-06 DIAGNOSIS — Z1509 Genetic susceptibility to other malignant neoplasm: Secondary | ICD-10-CM | POA: Diagnosis not present

## 2018-05-13 ENCOUNTER — Other Ambulatory Visit: Payer: Self-pay | Admitting: General Surgery

## 2018-05-13 DIAGNOSIS — N6320 Unspecified lump in the left breast, unspecified quadrant: Secondary | ICD-10-CM

## 2018-05-19 ENCOUNTER — Ambulatory Visit
Admission: RE | Admit: 2018-05-19 | Discharge: 2018-05-19 | Disposition: A | Discharge status: Home / Self Care | Payer: BLUE CROSS/BLUE SHIELD | Source: Ambulatory Visit | Attending: General Surgery | Admitting: General Surgery

## 2018-05-19 DIAGNOSIS — R928 Other abnormal and inconclusive findings on diagnostic imaging of breast: Secondary | ICD-10-CM | POA: Diagnosis not present

## 2018-05-19 DIAGNOSIS — N6320 Unspecified lump in the left breast, unspecified quadrant: Secondary | ICD-10-CM

## 2018-05-19 DIAGNOSIS — N641 Fat necrosis of breast: Secondary | ICD-10-CM | POA: Diagnosis not present

## 2018-07-30 ENCOUNTER — Ambulatory Visit: Payer: BLUE CROSS/BLUE SHIELD | Admitting: Obstetrics & Gynecology

## 2018-07-30 ENCOUNTER — Encounter: Payer: BLUE CROSS/BLUE SHIELD | Admitting: Gynecology

## 2018-07-30 ENCOUNTER — Encounter: Payer: Self-pay | Admitting: Obstetrics & Gynecology

## 2018-07-30 VITALS — BP 154/92 | Ht 64.0 in | Wt 156.0 lb

## 2018-07-30 DIAGNOSIS — Z1501 Genetic susceptibility to malignant neoplasm of breast: Secondary | ICD-10-CM

## 2018-07-30 DIAGNOSIS — G4700 Insomnia, unspecified: Secondary | ICD-10-CM

## 2018-07-30 DIAGNOSIS — E8940 Asymptomatic postprocedural ovarian failure: Secondary | ICD-10-CM | POA: Diagnosis not present

## 2018-07-30 DIAGNOSIS — Z9079 Acquired absence of other genital organ(s): Secondary | ICD-10-CM

## 2018-07-30 DIAGNOSIS — Z01419 Encounter for gynecological examination (general) (routine) without abnormal findings: Secondary | ICD-10-CM | POA: Diagnosis not present

## 2018-07-30 DIAGNOSIS — Z90710 Acquired absence of both cervix and uterus: Secondary | ICD-10-CM

## 2018-07-30 DIAGNOSIS — Z1502 Genetic susceptibility to malignant neoplasm of ovary: Secondary | ICD-10-CM | POA: Diagnosis not present

## 2018-07-30 DIAGNOSIS — Z1509 Genetic susceptibility to other malignant neoplasm: Secondary | ICD-10-CM | POA: Diagnosis not present

## 2018-07-30 DIAGNOSIS — Z9013 Acquired absence of bilateral breasts and nipples: Secondary | ICD-10-CM

## 2018-07-30 DIAGNOSIS — Z90722 Acquired absence of ovaries, bilateral: Secondary | ICD-10-CM

## 2018-07-30 DIAGNOSIS — Z23 Encounter for immunization: Secondary | ICD-10-CM

## 2018-07-30 MED ORDER — ESZOPICLONE 1 MG PO TABS: 1.0000 mg | ORAL_TABLET | Freq: Every evening | ORAL | 3 refills | Status: DC | PRN

## 2018-07-30 NOTE — Progress Notes (Signed)
Erica Burch 01/18/1971 [id number]   History:    47 y.o. G3P3L3 remarried.  Daughters 14-17-64 year old.  One stepdaughter 87 year old.  RP:  Established patient presenting for annual gyn exam   HPI: BrCa1 positive.  S/P prophylactic TAH/BSO 2012 and Bilateral Mastectomy 2017.  No pelvic pain.  Urine and bowel movements normal.  Patient felt a lump in the left breast for which a left breast ultrasound was done in August 2019 showing fat necrosis.  Will follow-up here for fasting health labs.  Past medical history,surgical history, family history and social history were all reviewed and documented in the EPIC chart.  Gynecologic History Patient's last menstrual period was 04/28/2009. Contraception: status post hysterectomy Last Pap: 06/2017. Results were: ASCUS/HPV high-risk negative Last mammogram: Status post bilateral mastectomy.  Had a left breast ultrasound August 2019 showing fat necrosis. Bone Density: Never Colonoscopy: Never  Obstetric History OB History  Gravida Para Term Preterm AB Living  3 3 3         SAB TAB Ectopic Multiple Live Births               # Outcome Date GA Lbr Len/2nd Weight Sex Delivery Anes PTL Lv  3 Term     M Vag-Spont     2 Term     F Vag-Spont     1 Term     F Vag-Spont        ROS: A ROS was performed and pertinent positives and negatives are included in the history.  GENERAL: No fevers or chills. HEENT: No change in vision, no earache, sore throat or sinus congestion. NECK: No pain or stiffness. CARDIOVASCULAR: No chest pain or pressure. No palpitations. PULMONARY: No shortness of breath, cough or wheeze. GASTROINTESTINAL: No abdominal pain, nausea, vomiting or diarrhea, melena or bright red blood per rectum. GENITOURINARY: No urinary frequency, urgency, hesitancy or dysuria. MUSCULOSKELETAL: No joint or muscle pain, no back pain, no recent trauma. DERMATOLOGIC: No rash, no itching, no lesions. ENDOCRINE: No polyuria, polydipsia, no heat or  cold intolerance. No recent change in weight. HEMATOLOGICAL: No anemia or easy bruising or bleeding. NEUROLOGIC: No headache, seizures, numbness, tingling or weakness. PSYCHIATRIC: No depression, no loss of interest in normal activity or change in sleep pattern.     Exam:   BP (!) 154/92   Ht 5' 4"  (1.626 m)   Wt 156 lb (70.8 kg)   LMP 04/28/2009   BMI 26.78 kg/m   Body mass index is 26.78 kg/m.  General appearance : Well developed well nourished female. No acute distress HEENT: Eyes: no retinal hemorrhage or exudates,  Neck supple, trachea midline, no carotid bruits, no thyroidmegaly Lungs: Clear to auscultation, no rhonchi or wheezes, or rib retractions  Heart: Regular rate and rhythm, no murmurs or gallops Breast:Examined in sitting and supine position were symmetrical in appearance, no palpable masses or tenderness,  no skin retraction, no nipple inversion, no nipple discharge, no skin discoloration, no axillary or supraclavicular lymphadenopathy Abdomen: no palpable masses or tenderness, no rebound or guarding Extremities: no edema or skin discoloration or tenderness  Pelvic: Vulva: Normal             Vagina: No gross lesions or discharge  Cervix/Uterus absent  Adnexa  Without masses or tenderness  Anus: Normal   Assessment/Plan:  47 y.o. female for annual exam   1. Well female exam with routine gynecological exam Gynecologic exam status post hysterectomy and surgical menopause.  Pap reflex done on the vaginal  vault, repeated today because Pap showed ASCUS with negative high-risk HPV in 2019.  Breast status post bilateral prophylactic mastectomy.  Body mass index 26.78.  Continue with fitness and healthy nutrition.  Follow-up fasting health labs here.  Recommend screening colonoscopy this year. - Pap IG w/ reflex to HPV when ASC-U - CBC; Future - Comp Met (CMET); Future - TSH; Future - VITAMIN D 25 Hydroxy (Vit-D Deficiency, Fractures); Future - Lipid panel; Future  2.  BRCA1-associated protein-1 tumor predisposition syndrome Status post prophylactic TAH/BSO and bilateral mastectomy.  3. Surgical menopause Well on no hormone replacement therapy.  Recommend vitamin D supplements, calcium intake of 1.5 g a day and regular weightbearing physical activity.  4. S/P TAH-BSO Prophylaxis for BRCA1 positive status  5. S/P bilateral mastectomy Prophylaxis for BRCA1 positive status  6. Insomnia, unspecified type Insomnia with occasional use of Lunesta as needed.  Prescription sent to pharmacy.  Encouraged to use melatonin, chamomile herbal tea physical activity in the afternoon and Unisom as needed.  Other orders - eszopiclone (LUNESTA) 1 MG TABS tablet; Take 1 tablet (1 mg total) by mouth at bedtime as needed for sleep. Take immediately before bedtime  Counseling on above issues and coordination of care more than 50% for 10 minutes.  Princess Bruins MD, 11:41 AM 07/30/2018

## 2018-08-01 NOTE — Patient Instructions (Signed)
1. Well female exam with routine gynecological exam Gynecologic exam status post hysterectomy and surgical menopause.  Pap reflex done on the vaginal vault, repeated today because Pap showed ASCUS with negative high-risk HPV in 2019.  Breast status post bilateral prophylactic mastectomy.  Follow-up fasting health labs here.  Recommend screening colonoscopy this year. - Pap IG w/ reflex to HPV when ASC-U - CBC; Future - Comp Met (CMET); Future - TSH; Future - VITAMIN D 25 Hydroxy (Vit-D Deficiency, Fractures); Future - Lipid panel; Future  2. BRCA1-associated protein-1 tumor predisposition syndrome Status post prophylactic TAH/BSO and bilateral mastectomy.  3. Surgical menopause Well on no hormone replacement therapy.  Recommend vitamin D supplements, calcium intake of 1.5 g a day and regular weightbearing physical activity.  4. S/P TAH-BSO Prophylaxis for BRCA1 positive status  5. S/P bilateral mastectomy Prophylaxis for BRCA1 positive status  6. Insomnia, unspecified type Insomnia with occasional use of Lunesta as needed.  Prescription sent to pharmacy.  Encouraged to use melatonin, chamomile herbal tea physical activity in the afternoon and Unisom as needed.  Other orders - eszopiclone (LUNESTA) 1 MG TABS tablet; Take 1 tablet (1 mg total) by mouth at bedtime as needed for sleep. Take immediately before bedtime  Erica Burch, it was a pleasure seeing you today!  I will inform you of your results as soon as they are available.

## 2018-08-02 ENCOUNTER — Other Ambulatory Visit: Payer: BLUE CROSS/BLUE SHIELD

## 2018-08-02 DIAGNOSIS — Z01419 Encounter for gynecological examination (general) (routine) without abnormal findings: Secondary | ICD-10-CM | POA: Diagnosis not present

## 2018-08-02 LAB — PAP IG W/ RFLX HPV ASCU

## 2018-08-03 LAB — CBC
HEMATOCRIT: 36.8 % (ref 35.0–45.0)
Hemoglobin: 12.7 g/dL (ref 11.7–15.5)
MCH: 28.7 pg (ref 27.0–33.0)
MCHC: 34.5 g/dL (ref 32.0–36.0)
MCV: 83.1 fL (ref 80.0–100.0)
MPV: 10.1 fL (ref 7.5–12.5)
Platelets: 269 10*3/uL (ref 140–400)
RBC: 4.43 10*6/uL (ref 3.80–5.10)
RDW: 12.9 % (ref 11.0–15.0)
WBC: 4.1 10*3/uL (ref 3.8–10.8)

## 2018-08-03 LAB — LIPID PANEL
CHOLESTEROL: 189 mg/dL (ref <200)
HDL: 54 mg/dL (ref >50)
LDL Cholesterol (Calc): 116 mg/dL (calc) — ABNORMAL HIGH
Non-HDL Cholesterol (Calc): 135 mg/dL (calc) — ABNORMAL HIGH (ref <130)
Total CHOL/HDL Ratio: 3.5 (calc) (ref <5.0)
Triglycerides: 87 mg/dL (ref <150)

## 2018-08-03 LAB — COMPREHENSIVE METABOLIC PANEL
AG Ratio: 2 (calc) (ref 1.0–2.5)
ALBUMIN MSPROF: 4.4 g/dL (ref 3.6–5.1)
ALKALINE PHOSPHATASE (APISO): 83 U/L (ref 33–115)
ALT: 16 U/L (ref 6–29)
AST: 17 U/L (ref 10–35)
BILIRUBIN TOTAL: 0.5 mg/dL (ref 0.2–1.2)
BUN: 11 mg/dL (ref 7–25)
CALCIUM: 9.3 mg/dL (ref 8.6–10.2)
CHLORIDE: 107 mmol/L (ref 98–110)
CO2: 28 mmol/L (ref 20–32)
CREATININE: 0.83 mg/dL (ref 0.50–1.10)
GLOBULIN: 2.2 g/dL (ref 1.9–3.7)
Glucose, Bld: 93 mg/dL (ref 65–99)
POTASSIUM: 4.3 mmol/L (ref 3.5–5.3)
SODIUM: 141 mmol/L (ref 135–146)
Total Protein: 6.6 g/dL (ref 6.1–8.1)

## 2018-08-03 LAB — VITAMIN D 25 HYDROXY (VIT D DEFICIENCY, FRACTURES): VIT D 25 HYDROXY: 25 ng/mL — AB (ref 30–100)

## 2018-08-03 LAB — TSH: TSH: 3.67 mIU/L

## 2018-08-03 NOTE — Addendum Note (Signed)
Addended by: Thurnell Garbe A on: 08/03/2018 11:12 AM   Modules accepted: Orders

## 2018-09-20 DIAGNOSIS — N3001 Acute cystitis with hematuria: Secondary | ICD-10-CM | POA: Diagnosis not present

## 2018-10-04 DIAGNOSIS — Z1509 Genetic susceptibility to other malignant neoplasm: Secondary | ICD-10-CM | POA: Diagnosis not present

## 2018-10-04 DIAGNOSIS — Z9013 Acquired absence of bilateral breasts and nipples: Secondary | ICD-10-CM | POA: Diagnosis not present

## 2018-10-04 DIAGNOSIS — Z1501 Genetic susceptibility to malignant neoplasm of breast: Secondary | ICD-10-CM | POA: Diagnosis not present

## 2019-02-16 ENCOUNTER — Other Ambulatory Visit: Payer: Self-pay | Admitting: Obstetrics & Gynecology

## 2019-02-17 ENCOUNTER — Other Ambulatory Visit: Payer: Self-pay

## 2019-02-17 NOTE — Telephone Encounter (Signed)
Controlled-6 mos Rx. Refills on old Rx have expired.

## 2019-02-17 NOTE — Telephone Encounter (Signed)
Called into pharmacy

## 2019-02-17 NOTE — Telephone Encounter (Signed)
Needs Annual/Gyn visit 06/2019.  Prescribe Lunesta until then.

## 2019-06-20 ENCOUNTER — Other Ambulatory Visit: Payer: Self-pay

## 2019-06-20 ENCOUNTER — Ambulatory Visit
Admission: EM | Admit: 2019-06-20 | Discharge: 2019-06-20 | Disposition: A | Discharge status: Home / Self Care | Payer: BC Managed Care – PPO | Attending: Emergency Medicine | Admitting: Emergency Medicine

## 2019-06-20 ENCOUNTER — Encounter: Payer: Self-pay | Admitting: Emergency Medicine

## 2019-06-20 DIAGNOSIS — R102 Pelvic and perineal pain: Secondary | ICD-10-CM | POA: Diagnosis not present

## 2019-06-20 LAB — POCT URINALYSIS DIP (MANUAL ENTRY)
Bilirubin, UA: NEGATIVE
Glucose, UA: NEGATIVE mg/dL
Ketones, POC UA: NEGATIVE mg/dL
Leukocytes, UA: NEGATIVE
Nitrite, UA: NEGATIVE
Protein Ur, POC: NEGATIVE mg/dL
Spec Grav, UA: 1.01 (ref 1.010–1.025)
Urobilinogen, UA: 0.2 E.U./dL
pH, UA: 7 (ref 5.0–8.0)

## 2019-06-20 NOTE — ED Triage Notes (Signed)
Pt presents to [facility] for assessment of urinary frequency and urgency with low output.  Patient denies flank pain or fevers.  States she has taken AZO.

## 2019-06-20 NOTE — ED Provider Notes (Signed)
[facility]    CSN: [id number] Arrival date & time: 06/20/19  1047      History   Chief Complaint Chief Complaint  Patient presents with  . Dysuria    HPI HONI NAME is a 48 y.o. female with history of kidney stones presenting for 1 week course of intermittent suprapubic pressure, worse at night.  Patient denies urinary incontinence, vaginal or pelvic pain, bleeding.  Patient is status post hysterectomy.  Patient denies change in her bowel habit, nausea, vomiting, fever.   Past Medical History:  Diagnosis Date  . Anxiety   . 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 Gross hematuria   . History of kidney stones   . History of uterine fibroid   . HSV-1 infection   . Nephrolithiasis    bilateral small  . Splenic artery aneurysm ([place])    small per CT 04-19-2015 by urologist, dr Jeffie Pollock  . Wears contact lenses     Patient Active Problem List   Diagnosis Date Noted  . BRCA positive 02/26/2016  . History of kidney stones 10/02/2014  . Vaginal atrophy 07/20/2014  . BRCA1 positive 07/02/2012  . Surgical menopause 07/02/2012  . HSV-1 infection 07/02/2012  . Family history of diabetes mellitus 07/02/2012  . Breast cancer genetic susceptibility 05/18/2011    Past Surgical History:  Procedure Laterality Date  . ABDOMINAL HYSTERECTOMY  05/19/2011  . BREAST RECONSTRUCTION Bilateral 09/23/2016   Procedure: BILATERAL REVISION BREAST RECONSTRUCTION WITH SILICONE IMPLANT EXCHANGE; PLACEMENT ALLODERM;  Surgeon: Irene Limbo, MD;  Location: [place];  Service: Plastics;  Laterality: Bilateral;  BILATERAL REVISION BREAST RECONSTRUCTION WITH SILICONE IMPLANT EXCHANGE PLACEMENT ALLODERM PLACEMENT  . BREAST RECONSTRUCTION WITH PLACEMENT OF TISSUE EXPANDER AND FLEX HD (ACELLULAR HYDRATED DERMIS) Bilateral 02/26/2016   Procedure: BILATERAL BREAST RECONSTRUCTION WITH PLACEMENT OF TISSUE EXPANDER AND POSSIBLE ACELLULAR HYDRATED DERMIS;  Surgeon: Irene Limbo, MD;   Location: [place];  Service: Plastics;  Laterality: Bilateral;  . BREAST SURGERY  2000   LUMPECTOMY-BENIGN  . CYSTOSCOPY WITH RETROGRADE PYELOGRAM, URETEROSCOPY AND STENT PLACEMENT Bilateral 05/24/2015   Procedure: CYSTOSCOPY WITH BILATERAL RETROGRADE PYELOGRAM, LEFT URETEROSCOPY WITH STONE EXTRACTION AND STENT PLACEMENT;  Surgeon: Irine Seal, MD;  Location: [facility];  Service: Urology;  Laterality: Bilateral;  . HOLMIUM LASER APPLICATION  10/25/5168   Procedure: HOLMIUM LASER APPLICATION;  Surgeon: Irine Seal, MD;  Location: [facility];  Service: Urology;;  . LAPAROSCOPIC CHOLECYSTECTOMY  1992  . LAPAROSCOPY  05/19/2011   Procedure: LAPAROSCOPY DIAGNOSTIC;  Surgeon: Terrance Mass, MD;  Location: [facility];  Service: Gynecology;  Laterality: N/A;  . LIPOSUCTION WITH LIPOFILLING Bilateral 06/10/2016   Procedure: LIPOFILLING FROM ABDOMEN TO BILATERAL CHEST;  Surgeon: Irene Limbo, MD;  Location: [place];  Service: Plastics;  Laterality: Bilateral;  . LIPOSUCTION WITH LIPOFILLING Bilateral 09/23/2016   Procedure: LIPOFILLING FROM ABDOMEN TO CHEST  BILATERAL;  Surgeon: Irene Limbo, MD;  Location: [place];  Service: Plastics;  Laterality: Bilateral;  . MASTECTOMY Bilateral 2017  . NIPPLE SPARING MASTECTOMY Bilateral 02/26/2016   Procedure: BILATERAL PROPHYLACTIC NIPPLE SPARING MASTECTOMIES;  Surgeon: Rolm Bookbinder, MD;  Location: [place];  Service: General;  Laterality: Bilateral;  . REMOVAL OF TISSUE EXPANDER AND PLACEMENT OF IMPLANT Bilateral 06/10/2016   Procedure: REMOVAL OF  BILATERAL TISSUE EXPANDERS AND PLACEMENT OF IMPLANTS;  Surgeon: Irene Limbo, MD;  Location: [place];  Service: Plastics;  Laterality: Bilateral;  . SALPINGOOPHORECTOMY  05/19/2011   Procedure:  SALPINGO OOPHERECTOMY;  Surgeon: Terrance Mass, MD;  Location: [facility];  Service: Gynecology;  Laterality: Bilateral;  . TONSILLECTOMY      as child    OB History    Gravida  3   Para  3   Term  3   Preterm      AB      Living        SAB      TAB      Ectopic      Multiple      Live Births               Home Medications    Prior to Admission medications   Medication Sig Start Date End Date Taking? Authorizing Provider  eszopiclone (LUNESTA) 1 MG TABS tablet TAKE ONE TABLET BY MOUTH AT BEDTIME AS NEEDED FOR SLEEP *take immediately before bedtime* 02/17/19   Princess Bruins, MD    Family History Family History  Problem Relation Age of Onset  . Breast cancer Mother   . Diverticulosis Mother   . Hypertension Father   . Diabetes Father   . Breast cancer Maternal Grandmother        X 2 WITH BREAST CANCER  . Breast cancer Maternal Aunt   . Breast cancer Paternal Grandmother     Social History Social History   Tobacco Use  . Smoking status: Former Smoker    Years: 5.00    Types: Cigarettes    Quit date: 04/29/1999    Years since quitting: 20.1  . Smokeless tobacco: Never Used  Substance Use Topics  . Alcohol use: No    Alcohol/week: 0.0 standard drinks  . Drug use: No     Allergies   Morphine and related   Review of Systems Review of Systems  Constitutional: Negative for fatigue and fever.  Respiratory: Negative for cough and shortness of breath.   Cardiovascular: Negative for chest pain and palpitations.  Gastrointestinal: Negative for abdominal distention, blood in stool, constipation, diarrhea, nausea, rectal pain and vomiting.  Genitourinary: Negative for dysuria, flank pain, frequency, hematuria, pelvic pain, urgency, vaginal bleeding, vaginal discharge and vaginal pain.     Physical Exam Triage Vital Signs ED Triage Vitals  Enc Vitals Group     BP 06/20/19 1108 (!) 163/123     Pulse Rate 06/20/19 1108 67     Resp 06/20/19 1108 18     Temp 06/20/19 1108 97.8 F (36.6 C)     Temp Source 06/20/19 1108 Oral     SpO2 06/20/19 1108 99 %     Weight --      Height  --      Head Circumference --      Peak Flow --      Pain Score 06/20/19 1109 0     Pain Loc --      Pain Edu? --      Excl. in [place]? --    No data found.  Updated Vital Signs BP 138/89 (BP Location: Right Arm)   Pulse 67   Temp 97.8 F (36.6 C) (Oral)   Resp 18   LMP 04/28/2009   SpO2 99%    Physical Exam Constitutional:      General: She is not in acute distress.    Appearance: She is normal weight. She is not ill-appearing.  HENT:     Head: Normocephalic and atraumatic.  Eyes:     General: No scleral icterus.    Pupils: Pupils  are equal, round, and reactive to light.  Cardiovascular:     Rate and Rhythm: Normal rate.  Pulmonary:     Effort: Pulmonary effort is normal.  Abdominal:     General: Bowel sounds are normal. There is no distension.     Palpations: Abdomen is soft.     Tenderness: There is no abdominal tenderness. There is no right CVA tenderness, left CVA tenderness, guarding or rebound.  Skin:    Coloration: Skin is not jaundiced or pale.  Neurological:     Mental Status: She is alert and oriented to person, place, and time.      UC Treatments / Results  Labs (all labs ordered are listed, but only abnormal results are displayed) Labs Reviewed  POCT URINALYSIS DIP (MANUAL ENTRY) - Abnormal; Notable for the following components:      Result Value   Color, UA colorless (*)    Blood, UA small (*)    All other components within normal limits    EKG   Radiology No results found.  Procedures Procedures (including critical care time)  Medications Ordered in UC Medications - No data to display  Initial Impression / Assessment and Plan / UC Course  I have reviewed the triage vital signs and the nursing notes.  Pertinent labs & imaging results that were available during my care of the patient were reviewed by me and considered in my medical decision making (see chart for details).     1.  Suprapubic pressure POCT urine dipstick done in  office, reviewed by me: Colorless urine with small blood, otherwise unremarkable.  Deferred culture at this time.  Patient states she has a urologist, intends on following up with them this week.  Advised patient to keep symptom log in the interim.  Will focus on oral hydration.  Return precautions discussed, patient verbalized understanding and is agreeable to plan. Final Clinical Impressions(s) / UC Diagnoses   Final diagnoses:  Suprapubic pressure     Discharge Instructions     Go to ER for worsening pain, blood in urine, back pain, fever    ED Prescriptions    None     PDMP not reviewed this encounter.   Hall[place], [place], [place] 06/21/19 1054

## 2019-06-20 NOTE — Discharge Instructions (Addendum)
Go to ER for worsening pain, blood in urine, back pain, fever

## 2019-06-20 NOTE — ED Notes (Signed)
Patient able to ambulate independently  

## 2019-06-21 ENCOUNTER — Telehealth: Payer: Self-pay

## 2019-06-21 ENCOUNTER — Encounter: Payer: Self-pay | Admitting: Emergency Medicine

## 2019-06-21 ENCOUNTER — Encounter: Payer: Self-pay | Admitting: Gynecology

## 2019-08-04 ENCOUNTER — Encounter: Payer: BLUE CROSS/BLUE SHIELD | Admitting: Obstetrics & Gynecology

## 2019-09-07 ENCOUNTER — Other Ambulatory Visit: Payer: Self-pay

## 2019-09-08 ENCOUNTER — Ambulatory Visit (INDEPENDENT_AMBULATORY_CARE_PROVIDER_SITE_OTHER): Payer: BC Managed Care – PPO | Admitting: Obstetrics & Gynecology

## 2019-09-08 ENCOUNTER — Encounter: Payer: Self-pay | Admitting: Obstetrics & Gynecology

## 2019-09-08 VITALS — BP 140/88 | Ht 64.0 in | Wt 145.0 lb

## 2019-09-08 DIAGNOSIS — Z23 Encounter for immunization: Secondary | ICD-10-CM | POA: Diagnosis not present

## 2019-09-08 DIAGNOSIS — Z1501 Genetic susceptibility to malignant neoplasm of breast: Secondary | ICD-10-CM

## 2019-09-08 DIAGNOSIS — Z01419 Encounter for gynecological examination (general) (routine) without abnormal findings: Secondary | ICD-10-CM | POA: Diagnosis not present

## 2019-09-08 DIAGNOSIS — Z90710 Acquired absence of both cervix and uterus: Secondary | ICD-10-CM

## 2019-09-08 DIAGNOSIS — E8940 Asymptomatic postprocedural ovarian failure: Secondary | ICD-10-CM | POA: Diagnosis not present

## 2019-09-08 DIAGNOSIS — Z9079 Acquired absence of other genital organ(s): Secondary | ICD-10-CM

## 2019-09-08 DIAGNOSIS — Z90722 Acquired absence of ovaries, bilateral: Secondary | ICD-10-CM

## 2019-09-08 DIAGNOSIS — Z1509 Genetic susceptibility to other malignant neoplasm: Secondary | ICD-10-CM

## 2019-09-08 DIAGNOSIS — R946 Abnormal results of thyroid function studies: Secondary | ICD-10-CM | POA: Diagnosis not present

## 2019-09-08 DIAGNOSIS — Z1502 Genetic susceptibility to malignant neoplasm of ovary: Secondary | ICD-10-CM

## 2019-09-08 DIAGNOSIS — E785 Hyperlipidemia, unspecified: Secondary | ICD-10-CM | POA: Diagnosis not present

## 2019-09-08 DIAGNOSIS — E559 Vitamin D deficiency, unspecified: Secondary | ICD-10-CM | POA: Diagnosis not present

## 2019-09-08 NOTE — Patient Instructions (Signed)
1. Well female exam with routine gynecological exam Gynecologic exam status post TAH/BSO.  Pap test November 2019 was negative and high-risk HPV was negative in 2018, no indication to repeat the Pap test this year.  Breast exam normal status post bilateral mastectomy with reconstruction.  No screening mammogram indicated.  Will do colonoscopy at age 48.  Fasting health labs here today. - CBC - Comp Met (CMET) - TSH - VITAMIN D 25 Hydroxy (Vit-D Deficiency, Fractures) - Lipid panel  2. S/P TAH-BSO  3. Surgical menopause Well on no hormone replacement therapy.  Vitamin D supplements, calcium intake of 1200 mg daily and regular weightbearing physical activity is recommended.  4. BRCA1-associated protein-1 tumor predisposition syndrome Status post prophylactic bilateral mastectomy and TAH/BSO.  5. Need for immunization against influenza - Flu Vaccine QUAD 36+ mos IM  Maudie Mercury, it was a pleasure seeing you today!  I will inform you of your results as soon as they are available.

## 2019-09-08 NOTE — Progress Notes (Signed)
Erica Burch [birth year] [id number]   History:    48 y.o. G3P3L3 remarried.  Daughters 15-18-36 year old who has a baby.  One stepdaughter 73 year old.  RP:  Established patient presenting for annual gyn exam   HPI: BrCa1 positive.  S/P prophylactic TAH/BSO 2012 and Bilateral Mastectomy 2017.  No pelvic pain.  Urine and bowel movements normal.  Patient felt a lump in the left breast for which a left breast ultrasound was done in August 2019 showing fat necrosis.  BMI 24.89.  Will increase physical activities.  Fasting health labs here today.  Past medical history,surgical history, family history and social history were all reviewed and documented in the EPIC chart.  Gynecologic History Patient's last menstrual period was 04/28/2009.   Obstetric History OB History  Gravida Para Term Preterm AB Living  3 3 3     3   SAB TAB Ectopic Multiple Live Births               # Outcome Date GA Lbr Len/2nd Weight Sex Delivery Anes PTL Lv  3 Term     M Vag-Spont     2 Term     F Vag-Spont     1 Term     F Vag-Spont        ROS: A ROS was performed and pertinent positives and negatives are included in the history.  GENERAL: No fevers or chills. HEENT: No change in vision, no earache, sore throat or sinus congestion. NECK: No pain or stiffness. CARDIOVASCULAR: No chest pain or pressure. No palpitations. PULMONARY: No shortness of breath, cough or wheeze. GASTROINTESTINAL: No abdominal pain, nausea, vomiting or diarrhea, melena or bright red blood per rectum. GENITOURINARY: No urinary frequency, urgency, hesitancy or dysuria. MUSCULOSKELETAL: No joint or muscle pain, no back pain, no recent trauma. DERMATOLOGIC: No rash, no itching, no lesions. ENDOCRINE: No polyuria, polydipsia, no heat or cold intolerance. No recent change in weight. HEMATOLOGICAL: No anemia or easy bruising or bleeding. NEUROLOGIC: No headache, seizures, numbness, tingling or weakness. PSYCHIATRIC: No depression, no loss of  interest in normal activity or change in sleep pattern.     Exam:   BP 140/88   Ht 5' 4"  (1.626 m)   Wt 145 lb (65.8 kg)   LMP 04/28/2009   BMI 24.89 kg/m   Body mass index is 24.89 kg/m.  General appearance : Well developed well nourished female. No acute distress HEENT: Eyes: no retinal hemorrhage or exudates,  Neck supple, trachea midline, no carotid bruits, no thyroidmegaly Lungs: Clear to auscultation, no rhonchi or wheezes, or rib retractions  Heart: Regular rate and rhythm, no murmurs or gallops Breast:Examined in sitting and supine position were symmetrical in appearance s/p Bilateral Mastectomy with reconstruction, no palpable masses or tenderness,  no skin retraction, no nipple inversion, no nipple discharge, no skin discoloration, no axillary or supraclavicular lymphadenopathy Abdomen: no palpable masses or tenderness, no rebound or guarding Extremities: no edema or skin discoloration or tenderness  Pelvic: Vulva: Normal             Vagina: No gross lesions or discharge  Cervix/Uterus absent  Adnexa  Without masses or tenderness  Anus: Normal   Assessment/Plan:  48 y.o. female for annual exam   1. Well female exam with routine gynecological exam Gynecologic exam status post TAH/BSO.  Pap test November 2019 was negative and high-risk HPV was negative in 2018, no indication to repeat the Pap test this year.  Breast exam normal status post bilateral  mastectomy with reconstruction.  No screening mammogram indicated.  Will do colonoscopy at age 69.  Fasting health labs here today. - CBC - Comp Met (CMET) - TSH - VITAMIN D 25 Hydroxy (Vit-D Deficiency, Fractures) - Lipid panel  2. S/P TAH-BSO  3. Surgical menopause Well on no hormone replacement therapy.  Vitamin D supplements, calcium intake of 1200 mg daily and regular weightbearing physical activity is recommended.  4. BRCA1-associated protein-1 tumor predisposition syndrome Status post prophylactic bilateral  mastectomy and TAH/BSO.  5. Need for immunization against influenza - Flu Vaccine QUAD 36+ mos IM  Princess Bruins MD, 8:15 AM 09/08/2019

## 2019-09-09 LAB — CBC
HCT: 39.1 % (ref 35.0–45.0)
Hemoglobin: 13.1 g/dL (ref 11.7–15.5)
MCV: 85.9 fL (ref 80.0–100.0)
MPV: 10.2 fL (ref 7.5–12.5)
Platelets: 266 10*3/uL (ref 140–400)
WBC: 4.4 10*3/uL (ref 3.8–10.8)

## 2019-09-13 ENCOUNTER — Other Ambulatory Visit: Payer: Self-pay

## 2019-09-13 DIAGNOSIS — E559 Vitamin D deficiency, unspecified: Secondary | ICD-10-CM

## 2019-09-13 DIAGNOSIS — R7989 Other specified abnormal findings of blood chemistry: Secondary | ICD-10-CM

## 2019-09-13 LAB — CBC
MCH: 28.8 pg (ref 27.0–33.0)
MCHC: 33.5 g/dL (ref 32.0–36.0)
RBC: 4.55 10*6/uL (ref 3.80–5.10)
RDW: 12.6 % (ref 11.0–15.0)

## 2019-09-13 LAB — TEST AUTHORIZATION

## 2019-09-13 LAB — LIPID PANEL
Cholesterol: 161 mg/dL (ref <200)
HDL: 48 mg/dL — ABNORMAL LOW (ref >=50)
LDL Cholesterol (Calc): 92 mg/dL (calc)
Non-HDL Cholesterol (Calc): 113 mg/dL (calc) (ref <130)
Total CHOL/HDL Ratio: 3.4 (calc) (ref <5.0)
Triglycerides: 110 mg/dL (ref <150)

## 2019-09-13 LAB — COMPREHENSIVE METABOLIC PANEL
AG Ratio: 2.1 (calc) (ref 1.0–2.5)
ALT: 10 U/L (ref 6–29)
AST: 17 U/L (ref 10–35)
Albumin: 4.4 g/dL (ref 3.6–5.1)
Alkaline phosphatase (APISO): 71 U/L (ref 31–125)
BUN: 10 mg/dL (ref 7–25)
CO2: 27 mmol/L (ref 20–32)
Calcium: 9.2 mg/dL (ref 8.6–10.2)
Chloride: 102 mmol/L (ref 98–110)
Creat: 0.83 mg/dL (ref 0.50–1.10)
Globulin: 2.1 g/dL (calc) (ref 1.9–3.7)
Glucose, Bld: 84 mg/dL (ref 65–99)
Potassium: 3.9 mmol/L (ref 3.5–5.3)
Sodium: 138 mmol/L (ref 135–146)
Total Bilirubin: 0.7 mg/dL (ref 0.2–1.2)
Total Protein: 6.5 g/dL (ref 6.1–8.1)

## 2019-09-13 LAB — T4, FREE: Free T4: 1.2 ng/dL (ref 0.8–1.8)

## 2019-09-13 LAB — TSH: TSH: 6.25 mIU/L — ABNORMAL HIGH

## 2019-09-13 LAB — T3, FREE: T3, Free: 3.9 pg/mL (ref 2.3–4.2)

## 2019-09-13 LAB — VITAMIN D 25 HYDROXY (VIT D DEFICIENCY, FRACTURES): Vit D, 25-Hydroxy: 23 ng/mL — ABNORMAL LOW (ref 30–100)

## 2019-09-13 MED ORDER — VITAMIN D (ERGOCALCIFEROL) 1.25 MG (50000 UNIT) PO CAPS: 50000.0000 [IU] | ORAL_CAPSULE | ORAL | 0 refills | Status: DC

## 2019-10-05 DIAGNOSIS — Z1509 Genetic susceptibility to other malignant neoplasm: Secondary | ICD-10-CM | POA: Diagnosis not present

## 2019-10-05 DIAGNOSIS — Z1501 Genetic susceptibility to malignant neoplasm of breast: Secondary | ICD-10-CM | POA: Diagnosis not present

## 2019-10-05 DIAGNOSIS — Z9013 Acquired absence of bilateral breasts and nipples: Secondary | ICD-10-CM | POA: Diagnosis not present

## 2019-10-14 MED ORDER — ESZOPICLONE 2 MG PO TABS: 2.0000 mg | ORAL_TABLET | Freq: Every evening | ORAL | 3 refills | Status: DC | PRN

## 2019-11-01 DIAGNOSIS — U071 COVID-19: Secondary | ICD-10-CM | POA: Diagnosis not present

## 2019-11-28 MED ORDER — ALPRAZOLAM 0.25 MG PO TABS: ORAL_TABLET | ORAL | 0 refills | Status: DC

## 2019-11-28 NOTE — Telephone Encounter (Signed)
I spoke with patient and advised Rx sent. I instructed her to start with one every six hours but she could take up to 2 tabs every 6 hours if needed.  Rx called in per verbal conversation with Dr. Dellis Filbert.

## 2020-01-24 ENCOUNTER — Telehealth: Payer: Self-pay

## 2020-01-24 NOTE — Telephone Encounter (Signed)
On lab result note 09/08/19 "FT3 and FT4 have been completed and appear to be normal. Please advise what to tell patient regarding TSH result 6.25."   I don't see the answer to the question above. TSH was 6.25 so I assume you will want to recheck it. When to have her return for that?    Dr. Dellis Filbert replied "If she has a Fam MD, would recommend repeating a TSH with FT3, FT4 with Fam MD in 6 months. Can come back here with me if prefers."  (June 2021).  Spoke with patient and informed her. She wants to return here to our office as she states she does not have a PCP.  Orders placed. She will call for lab appointment.

## 2020-01-25 ENCOUNTER — Other Ambulatory Visit: Payer: Self-pay

## 2020-01-25 ENCOUNTER — Other Ambulatory Visit: Payer: BC Managed Care – PPO

## 2020-01-25 DIAGNOSIS — E559 Vitamin D deficiency, unspecified: Secondary | ICD-10-CM

## 2020-01-25 DIAGNOSIS — R7989 Other specified abnormal findings of blood chemistry: Secondary | ICD-10-CM

## 2020-01-26 LAB — VITAMIN D 25 HYDROXY (VIT D DEFICIENCY, FRACTURES): Vit D, 25-Hydroxy: 52 ng/mL (ref 30–100)

## 2020-01-30 ENCOUNTER — Encounter: Payer: Self-pay | Admitting: *Deleted

## 2020-04-18 ENCOUNTER — Other Ambulatory Visit: Payer: Self-pay

## 2020-04-18 ENCOUNTER — Other Ambulatory Visit: Payer: BC Managed Care – PPO

## 2020-04-18 DIAGNOSIS — R7989 Other specified abnormal findings of blood chemistry: Secondary | ICD-10-CM

## 2020-04-19 LAB — T3, FREE: T3, Free: 3.2 pg/mL (ref 2.3–4.2)

## 2020-04-19 LAB — TSH: TSH: 3.42 mIU/L

## 2020-04-19 LAB — T4, FREE: Free T4: 1.1 ng/dL (ref 0.8–1.8)

## 2020-05-22 DIAGNOSIS — R350 Frequency of micturition: Secondary | ICD-10-CM | POA: Diagnosis not present

## 2020-06-07 NOTE — Telephone Encounter (Signed)
Annual exam is due in December.

## 2020-06-08 MED ORDER — ESZOPICLONE 2 MG PO TABS: 2.0000 mg | ORAL_TABLET | Freq: Every evening | ORAL | 1 refills | Status: DC | PRN

## 2020-06-12 DIAGNOSIS — N200 Calculus of kidney: Secondary | ICD-10-CM | POA: Diagnosis not present

## 2020-06-12 DIAGNOSIS — R3915 Urgency of urination: Secondary | ICD-10-CM | POA: Diagnosis not present

## 2020-06-12 DIAGNOSIS — R8271 Bacteriuria: Secondary | ICD-10-CM | POA: Diagnosis not present

## 2020-06-12 DIAGNOSIS — R3911 Hesitancy of micturition: Secondary | ICD-10-CM | POA: Diagnosis not present

## 2020-06-21 MED ORDER — ESZOPICLONE 2 MG PO TABS: 2.0000 mg | ORAL_TABLET | Freq: Every evening | ORAL | 1 refills | Status: DC | PRN

## 2020-06-21 NOTE — Addendum Note (Signed)
Addended by: Thamas Jaegers on: 06/21/2020 08:51 AM   Modules accepted: Orders

## 2020-09-11 ENCOUNTER — Encounter: Payer: Self-pay | Admitting: Obstetrics & Gynecology

## 2020-09-11 ENCOUNTER — Ambulatory Visit (INDEPENDENT_AMBULATORY_CARE_PROVIDER_SITE_OTHER): Payer: BC Managed Care – PPO | Admitting: Obstetrics & Gynecology

## 2020-09-11 ENCOUNTER — Other Ambulatory Visit: Payer: Self-pay

## 2020-09-11 VITALS — BP 134/86 | Ht 64.0 in | Wt 157.0 lb

## 2020-09-11 DIAGNOSIS — Z9079 Acquired absence of other genital organ(s): Secondary | ICD-10-CM

## 2020-09-11 DIAGNOSIS — Z23 Encounter for immunization: Secondary | ICD-10-CM

## 2020-09-11 DIAGNOSIS — E785 Hyperlipidemia, unspecified: Secondary | ICD-10-CM | POA: Diagnosis not present

## 2020-09-11 DIAGNOSIS — F419 Anxiety disorder, unspecified: Secondary | ICD-10-CM

## 2020-09-11 DIAGNOSIS — Z90710 Acquired absence of both cervix and uterus: Secondary | ICD-10-CM | POA: Diagnosis not present

## 2020-09-11 DIAGNOSIS — Z1501 Genetic susceptibility to malignant neoplasm of breast: Secondary | ICD-10-CM

## 2020-09-11 DIAGNOSIS — E8940 Asymptomatic postprocedural ovarian failure: Secondary | ICD-10-CM

## 2020-09-11 DIAGNOSIS — Z1329 Encounter for screening for other suspected endocrine disorder: Secondary | ICD-10-CM | POA: Diagnosis not present

## 2020-09-11 DIAGNOSIS — Z1502 Genetic susceptibility to malignant neoplasm of ovary: Secondary | ICD-10-CM

## 2020-09-11 DIAGNOSIS — Z90722 Acquired absence of ovaries, bilateral: Secondary | ICD-10-CM

## 2020-09-11 DIAGNOSIS — E559 Vitamin D deficiency, unspecified: Secondary | ICD-10-CM | POA: Diagnosis not present

## 2020-09-11 DIAGNOSIS — Z1509 Genetic susceptibility to other malignant neoplasm: Secondary | ICD-10-CM

## 2020-09-11 DIAGNOSIS — Z01419 Encounter for gynecological examination (general) (routine) without abnormal findings: Secondary | ICD-10-CM

## 2020-09-11 MED ORDER — ALPRAZOLAM 0.25 MG PO TABS: ORAL_TABLET | ORAL | 0 refills | Status: DC

## 2020-09-11 NOTE — Addendum Note (Signed)
Addended by: Thurnell Garbe A on: 09/11/2020 04:43 PM   Modules accepted: Orders

## 2020-09-11 NOTE — Progress Notes (Signed)
Erica Burch 01-24-1971 [id number]   History:    49 y.o. G3P3L3remarried. Daughters 16-19-40 year old who has a 74 yo boy. One stepdaughter 81 year old.  [id number] presenting for annual gyn exam   LSL:HTDS2 positive. S/PprophylacticTAH/BSO 2012and Bilateral Mastectomy2017. Some hot flushes.No pelvic pain. Urine and bowel movements normal. Patient felt a lump in the left breast for which a left breast ultrasound was done in August 2019 showing fat necrosis.  BMI 26.95.  Will increase physical activities. Fasting health labs here today.  Past medical history,surgical history, family history and social history were all reviewed and documented in the EPIC chart.  Gynecologic History Patient's last menstrual period was 04/28/2009.  Obstetric History OB History  Gravida Para Term Preterm AB Living  3 3 3     3   SAB IAB Ectopic Multiple Live Births               # Outcome Date GA Lbr Len/2nd Weight Sex Delivery Anes PTL Lv  3 Term     M Vag-Spont     2 Term     F Vag-Spont     1 Term     F Vag-Spont        ROS: A ROS was performed and pertinent positives and negatives are included in the history.  GENERAL: No fevers or chills. HEENT: No change in vision, no earache, sore throat or sinus congestion. NECK: No pain or stiffness. CARDIOVASCULAR: No chest pain or pressure. No palpitations. PULMONARY: No shortness of breath, cough or wheeze. GASTROINTESTINAL: No abdominal pain, nausea, vomiting or diarrhea, melena or bright red blood per rectum. GENITOURINARY: No urinary frequency, urgency, hesitancy or dysuria. MUSCULOSKELETAL: No joint or muscle pain, no back pain, no recent trauma. DERMATOLOGIC: No rash, no itching, no lesions. ENDOCRINE: No polyuria, polydipsia, no heat or cold intolerance. No recent change in weight. HEMATOLOGICAL: No anemia or easy bruising or bleeding. NEUROLOGIC: No headache, seizures, numbness, tingling or weakness. PSYCHIATRIC: No  depression, no loss of interest in normal activity or change in sleep pattern.     Exam:   BP 134/86   Ht 5' 4"  (1.626 m)   Wt 157 lb (71.2 kg)   LMP 04/28/2009   BMI 26.95 kg/m   Body mass index is 26.95 kg/m.  General appearance : Well developed well nourished female. No acute distress HEENT: Eyes: no retinal hemorrhage or exudates,  Neck supple, trachea midline, no carotid bruits, no thyroidmegaly Lungs: Clear to auscultation, no rhonchi or wheezes, or rib retractions  Heart: Regular rate and rhythm, no murmurs or gallops Breast:Examined in sitting and supine position were symmetrical in appearance, no palpable masses or tenderness,  no skin retraction, no nipple inversion, no nipple discharge, no skin discoloration, no axillary or supraclavicular lymphadenopathy Abdomen: no palpable masses or tenderness, no rebound or guarding Extremities: no edema or skin discoloration or tenderness  Pelvic: Vulva: Normal             Vagina: No gross lesions or discharge  Cervix/Uterus absent  Adnexa  Without masses or tenderness  Anus: Normal   Assessment/Plan:  49 y.o. female for annual exam   1. Well female exam with routine gynecological exam Gynecologic exam status post TAH/BSO. No indication for Pap test this year. Will do Pap test every 5 years. Breast exam status post bilateral mastectomy with implants. Fasting health labs here today. - CBC - Comp Met (CMET) - TSH - Lipid panel - VITAMIN D 25 Hydroxy (Vit-D Deficiency, Fractures)  2.  S/P TAH-BSO  3. Surgical menopause Having some hot flushes. No hormone replacement therapy given BRCA1 positive. Will try Phyto-estrogens.   4. BRCA1-associated protein-1 tumor predisposition syndrome S/P Bilateral Mastectomy/implants.  TAH/BSO.  Will do Pelvic US every 5 yrs.  5. Anxiety Using Xanax 0.25 PRN.  Prescription sent to pharmacy.  Recommend increasing her physical activities.  Other orders - cholecalciferol (VITAMIN D3) 25 MCG  (1000 UNIT) tablet; Take 1,000 Units by mouth daily. EVERY OTHER DAY - ALPRAZolam (XANAX) 0.25 MG tablet; Take one tabs daily prn anxiety.  Other orders - cholecalciferol (VITAMIN D3) 25 MCG (1000 UNIT) tablet; Take 1,000 Units by mouth daily. EVERY OTHER DAY - ALPRAZolam (XANAX) 0.25 MG tablet; Take one tabs daily prn anxiety.  Princess Bruins MD, 9:56 AM 09/11/2020

## 2020-09-12 LAB — TSH: TSH: 3.03 mIU/L

## 2020-09-12 LAB — CBC
HCT: 38.7 % (ref 35.0–45.0)
Hemoglobin: 13.3 g/dL (ref 11.7–15.5)
MCH: 29.2 pg (ref 27.0–33.0)
MCHC: 34.4 g/dL (ref 32.0–36.0)
MCV: 84.9 fL (ref 80.0–100.0)
MPV: 10.2 fL (ref 7.5–12.5)
Platelets: 250 10*3/uL (ref 140–400)
RBC: 4.56 10*6/uL (ref 3.80–5.10)
RDW: 12.6 % (ref 11.0–15.0)
WBC: 3.8 10*3/uL (ref 3.8–10.8)

## 2020-09-12 LAB — VITAMIN D 25 HYDROXY (VIT D DEFICIENCY, FRACTURES): Vit D, 25-Hydroxy: 42 ng/mL (ref 30–100)

## 2020-09-12 LAB — LIPID PANEL
Cholesterol: 200 mg/dL — ABNORMAL HIGH (ref <200)
HDL: 59 mg/dL (ref >=50)
LDL Cholesterol (Calc): 119 mg/dL (calc) — ABNORMAL HIGH
Non-HDL Cholesterol (Calc): 141 mg/dL (calc) — ABNORMAL HIGH (ref <130)
Total CHOL/HDL Ratio: 3.4 (calc) (ref <5.0)
Triglycerides: 117 mg/dL (ref <150)

## 2020-09-12 LAB — COMPREHENSIVE METABOLIC PANEL
AG Ratio: 2 (calc) (ref 1.0–2.5)
ALT: 12 U/L (ref 6–29)
AST: 19 U/L (ref 10–35)
Albumin: 4.5 g/dL (ref 3.6–5.1)
Alkaline phosphatase (APISO): 67 U/L (ref 31–125)
BUN: 14 mg/dL (ref 7–25)
CO2: 26 mmol/L (ref 20–32)
Calcium: 9.2 mg/dL (ref 8.6–10.2)
Chloride: 106 mmol/L (ref 98–110)
Creat: 0.78 mg/dL (ref 0.50–1.10)
Globulin: 2.2 g/dL (calc) (ref 1.9–3.7)
Glucose, Bld: 85 mg/dL (ref 65–99)
Potassium: 4.5 mmol/L (ref 3.5–5.3)
Sodium: 140 mmol/L (ref 135–146)
Total Bilirubin: 0.7 mg/dL (ref 0.2–1.2)
Total Protein: 6.7 g/dL (ref 6.1–8.1)

## 2020-09-24 MED ORDER — ALPRAZOLAM 0.25 MG PO TABS: ORAL_TABLET | ORAL | 0 refills | Status: DC

## 2020-10-04 DIAGNOSIS — Z1509 Genetic susceptibility to other malignant neoplasm: Secondary | ICD-10-CM | POA: Diagnosis not present

## 2020-10-04 DIAGNOSIS — Z9013 Acquired absence of bilateral breasts and nipples: Secondary | ICD-10-CM | POA: Diagnosis not present

## 2020-10-04 DIAGNOSIS — Z1501 Genetic susceptibility to malignant neoplasm of breast: Secondary | ICD-10-CM | POA: Diagnosis not present

## 2020-12-24 ENCOUNTER — Other Ambulatory Visit: Payer: Self-pay

## 2020-12-24 ENCOUNTER — Ambulatory Visit (INDEPENDENT_AMBULATORY_CARE_PROVIDER_SITE_OTHER): Payer: BC Managed Care – PPO | Admitting: Nurse Practitioner

## 2020-12-24 ENCOUNTER — Encounter: Payer: Self-pay | Admitting: Nurse Practitioner

## 2020-12-24 VITALS — BP 138/82 | HR 68 | Temp 97.4°F | Ht 64.0 in | Wt 161.3 lb

## 2020-12-24 DIAGNOSIS — F411 Generalized anxiety disorder: Secondary | ICD-10-CM | POA: Diagnosis not present

## 2020-12-24 DIAGNOSIS — Z1509 Genetic susceptibility to other malignant neoplasm: Secondary | ICD-10-CM

## 2020-12-24 DIAGNOSIS — F5101 Primary insomnia: Secondary | ICD-10-CM

## 2020-12-24 DIAGNOSIS — Z1501 Genetic susceptibility to malignant neoplasm of breast: Secondary | ICD-10-CM

## 2020-12-24 DIAGNOSIS — Z7689 Persons encountering health services in other specified circumstances: Secondary | ICD-10-CM

## 2020-12-24 MED ORDER — ALPRAZOLAM 0.25 MG PO TABS
ORAL_TABLET | ORAL | 1 refills | Status: DC
Start: 2020-12-24 — End: 2021-05-07

## 2020-12-24 MED ORDER — ESZOPICLONE 2 MG PO TABS: 2.0000 mg | ORAL_TABLET | Freq: Every evening | ORAL | 0 refills | Status: DC | PRN

## 2020-12-24 NOTE — Patient Instructions (Signed)
[email or web address]">  Generalized Anxiety Disorder, Adult Generalized anxiety disorder (GAD) is a mental health condition. Unlike normal worries, anxiety related to GAD is not triggered by a specific event. These worries do not fade or get better with time. GAD interferes with relationships, work, and school. GAD symptoms can vary from mild to severe. People with severe GAD can have intense waves of anxiety with physical symptoms that are similar to panic attacks. What are the causes? The exact cause of GAD is not known, but the following are believed to have an impact:  Differences in natural brain chemicals.  Genes passed down from parents to children.  Differences in the way threats are perceived.  Development during childhood.  Personality. What increases the risk? The following factors may make you more likely to develop this condition:  Being female.  Having a family history of anxiety disorders.  Being very shy.  Experiencing very stressful life events, such as the death of a loved one.  Having a very stressful family environment. What are the signs or symptoms? People with GAD often worry excessively about many things in their lives, such as their health and family. Symptoms may also include:  Mental and emotional symptoms: ? Worrying excessively about natural disasters. ? Fear of being late. ? Difficulty concentrating. ? Fears that others are judging your performance.  Physical symptoms: ? Fatigue. ? Headaches, muscle tension, muscle twitches, trembling, or feeling shaky. ? Feeling like your heart is pounding or beating very fast. ? Feeling out of breath or like you cannot take a deep breath. ? Having trouble falling asleep or staying asleep, or experiencing restlessness. ? Sweating. ? Nausea, diarrhea, or irritable bowel syndrome (IBS).  Behavioral symptoms: ? Experiencing erratic moods or irritability. ? Avoidance of new situations. ? Avoidance of  people. ? Extreme difficulty making decisions. How is this diagnosed? This condition is diagnosed based on your symptoms and medical history. You will also have a physical exam. Your health care provider may perform tests to rule out other possible causes of your symptoms. To be diagnosed with GAD, a person must have anxiety that:  Is out of his or her control.  Affects several different aspects of his or her life, such as work and relationships.  Causes distress that makes him or her unable to take part in normal activities.  Includes at least three symptoms of GAD, such as restlessness, fatigue, trouble concentrating, irritability, muscle tension, or sleep problems. Before your health care provider can confirm a diagnosis of GAD, these symptoms must be present more days than they are not, and they must last for 6 months or longer. How is this treated? This condition may be treated with:  Medicine. Antidepressant medicine is usually prescribed for long-term daily control. Anti-anxiety medicines may be added in severe cases, especially when panic attacks occur.  Talk therapy (psychotherapy). Certain types of talk therapy can be helpful in treating GAD by providing support, education, and guidance. Options include: ? Cognitive behavioral therapy (CBT). People learn coping skills and self-calming techniques to ease their physical symptoms. They learn to identify unrealistic thoughts and behaviors and to replace them with more appropriate thoughts and behaviors. ? Acceptance and commitment therapy (ACT). This treatment teaches people how to be mindful as a way to cope with unwanted thoughts and feelings. ? Biofeedback. This process trains you to manage your body's response (physiological response) through breathing techniques and relaxation methods. You will work with a therapist while machines are used to monitor your physical   symptoms.  Stress management techniques. These include yoga,  meditation, and exercise. A mental health specialist can help determine which treatment is best for you. Some people see improvement with one type of therapy. However, other people require a combination of therapies.   Follow these instructions at home: Lifestyle  Maintain a consistent routine and schedule.  Anticipate stressful situations. Create a plan, and allow extra time to work with your plan.  Practice stress management or self-calming techniques that you have learned from your therapist or your health care provider. General instructions  Take over-the-counter and prescription medicines only as told by your health care provider.  Understand that you are likely to have setbacks. Accept this and be kind to yourself as you persist to take better care of yourself.  Recognize and accept your accomplishments, even if you judge them as small.  Keep all follow-up visits as told by your health care provider. This is important. Contact a health care provider if:  Your symptoms do not get better.  Your symptoms get worse.  You have signs of depression, such as: ? A persistently sad or irritable mood. ? Loss of enjoyment in activities that used to bring you joy. ? Change in weight or eating. ? Changes in sleeping habits. ? Avoiding friends or family members. ? Loss of energy for normal tasks. ? Feelings of guilt or worthlessness. Get help right away if:  You have serious thoughts about hurting yourself or others. If you ever feel like you may hurt yourself or others, or have thoughts about taking your own life, get help right away. Go to your nearest emergency department or:  Call your local emergency services (911 in the [place]  Call a suicide crisis helpline, such as the National Suicide Prevention Lifeline at 1-800-273-8255. This is open 24 hours a day in the [place]  Text the Crisis Text Line at 741741 (in the [place] Summary  Generalized anxiety disorder (GAD) is a mental  health condition that involves worry that is not triggered by a specific event.  People with GAD often worry excessively about many things in their lives, such as their health and family.  GAD may cause symptoms such as restlessness, trouble concentrating, sleep problems, frequent sweating, nausea, diarrhea, headaches, and trembling or muscle twitching.  A mental health specialist can help determine which treatment is best for you. Some people see improvement with one type of therapy. However, other people require a combination of therapies. This information is not intended to replace advice given to you by your health care provider. Make sure you discuss any questions you have with your health care provider. Document Revised: 07/06/2019 Document Reviewed: 07/06/2019 Elsevier Patient Education  2021 Elsevier Inc.  

## 2020-12-24 NOTE — Progress Notes (Signed)
New Patient Office Visit  Subjective:  Patient ID: Erica Burch, female    DOB: 06-10-1971  Age: 50 y.o. MRN: [id number]  CC:  Chief Complaint  Patient presents with  . New Patient (Initial Visit)    HPI Erica Burch presents to establish new primary care provider. She has not had PCP in the past. Her GYN provider has been treating her for everything thus far. Her regular GYN provider recently retired. The new provider suggested that patient seek PCP care. The patient recently saw GYN provider for well-woman exam. This was 08/2020.  The patinet states that she is positive for BRACA 1 gene. She has strong family history of breast cancer. She has had preventive hysterectomy and double mastectomy with reconstruction. Hysterectomy was 2012 and mastectomy was 2017 with reconstruction following that. She does see plastic surgeon yearly to check for problems and concerns.  The patient reports history of generalized anxiety disorder. She does take alprazolam 0.25 daily when needed. She states that recently, she has used it more often as she has had a few deaths in the family which has effected her deeply. She also has adjustment insomnia. Her GYN provider had prescribed her lunesta 59m at bedtime when needed. She states that she takes this two to three times per week. With recently elevated anxiety, her blood pressure has been p. She does check it at home routinely. It generally runs between 1160Fto 1093Asystolic, and 735Tdiastolic.  The patient denies chest pain, chest pressure, headache, or shortness of breath. She denies abdominal pain, nausea, vomiting, or diarrhea.  She does see a urologist routinely, as she has bilateral, non obstructing renal stones.   Past Medical History:  Diagnosis Date  . Anxiety   . 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 Gross hematuria   . History of kidney stones   . History of uterine fibroid   . HSV-1 infection   . Nephrolithiasis    bilateral small  . Splenic  artery aneurysm ([facility]    small per CT 04-19-2015 by urologist, dr wJeffie Pollock . Wears contact lenses     Past Surgical History:  Procedure Laterality Date  . ABDOMINAL HYSTERECTOMY  05/19/2011  . BREAST RECONSTRUCTION Bilateral 09/23/2016   Procedure: BILATERAL REVISION BREAST RECONSTRUCTION WITH SILICONE IMPLANT EXCHANGE; PLACEMENT ALLODERM;  Surgeon: BIrene Limbo MD;  [facility]  Service: Plastics;  Laterality: Bilateral;  BILATERAL REVISION BREAST RECONSTRUCTION WITH SILICONE IMPLANT EXCHANGE PLACEMENT ALLODERM PLACEMENT  . BREAST RECONSTRUCTION WITH PLACEMENT OF TISSUE EXPANDER AND FLEX HD (ACELLULAR HYDRATED DERMIS) Bilateral 02/26/2016   Procedure: BILATERAL BREAST RECONSTRUCTION WITH PLACEMENT OF TISSUE EXPANDER AND POSSIBLE ACELLULAR HYDRATED DERMIS;  Surgeon: BIrene Limbo MD;  [facility]  Service: Plastics;  Laterality: Bilateral;  . BREAST SURGERY  2000   LUMPECTOMY-BENIGN  . CYSTOSCOPY WITH RETROGRADE PYELOGRAM, URETEROSCOPY AND STENT PLACEMENT Bilateral 05/24/2015   Procedure: CYSTOSCOPY WITH BILATERAL RETROGRADE PYELOGRAM, LEFT URETEROSCOPY WITH STONE EXTRACTION AND STENT PLACEMENT;  Surgeon: JIrine Seal MD;  [facility]  Service: Urology;  Laterality: Bilateral;  . HOLMIUM LASER APPLICATION  87/32/2025  Procedure: HOLMIUM LASER APPLICATION;  Surgeon: JIrine Seal MD;  [facility]  Service: Urology;;  . LAPAROSCOPIC CHOLECYSTECTOMY  1992  . LAPAROSCOPY  05/19/2011   Procedure: LAPAROSCOPY DIAGNOSTIC;  Surgeon: JTerrance Mass MD;  [facility];  Service: Gynecology;  Laterality: N/A;  . LIPOSUCTION WITH LIPOFILLING Bilateral 06/10/2016   Procedure: LIPOFILLING FROM ABDOMEN  TO BILATERAL CHEST;  Surgeon: Brinda Thimmappa, MD;  Location: [facility];  Service: Plastics;  Laterality: Bilateral;  . LIPOSUCTION WITH LIPOFILLING Bilateral 09/23/2016   Procedure: LIPOFILLING FROM  ABDOMEN TO CHEST  BILATERAL;  Surgeon: Brinda Thimmappa, MD;  Location: [facility];  Service: Plastics;  Laterality: Bilateral;  . MASTECTOMY Bilateral 2017  . NIPPLE SPARING MASTECTOMY Bilateral 02/26/2016   Procedure: BILATERAL PROPHYLACTIC NIPPLE SPARING MASTECTOMIES;  Surgeon: Matthew Wakefield, MD;  Location[facility];  Service: General;  Laterality: Bilateral;  . REMOVAL OF TISSUE EXPANDER AND PLACEMENT OF IMPLANT Bilateral 06/10/2016   Procedure: REMOVAL OF  BILATERAL TISSUE EXPANDERS AND PLACEMENT OF IMPLANTS;  Surgeon: Brinda Thimmappa, MD;  Location: [facility];  Service: Plastics;  Laterality: Bilateral;  . SALPINGOOPHORECTOMY  05/19/2011   Procedure: SALPINGO OOPHERECTOMY;  Surgeon: Juan H Fernandez, MD;  Location: [facility];  Service: Gynecology;  Laterality: Bilateral;  . TONSILLECTOMY     as child    Family History  Problem Relation Age of Onset  . Breast cancer Mother   . Diverticulosis Mother   . Hypertension Father   . Diabetes Father   . Breast cancer Maternal Grandmother        X 2 WITH BREAST CANCER  . Breast cancer Maternal Aunt   . Breast cancer Paternal Grandmother     Social History   Socioeconomic History  . Marital status: Married    Spouse name: Not on file  . Number of children: Not on file  . Years of education: Not on file  . Highest education level: Not on file  Occupational History  . Not on file  Tobacco Use  . Smoking status: Former Smoker    Years: 5.00    Types: Cigarettes    Quit date: 04/29/1999    Years since quitting: 21.6  . Smokeless tobacco: Never Used  Vaping Use  . Vaping Use: Never used  Substance and Sexual Activity  . Alcohol use: No    Alcohol/week: 0.0 standard drinks  . Drug use: No  . Sexual activity: Yes    Partners: Male    Birth control/protection: Surgical    Comment: 1st intercourse- 16, partners- 3, married - 21 yr s  Other Topics Concern  . Not on file  Social History Narrative  . Not  on file   Social Determinants of Health   Financial Resource Strain: Not on file  Food Insecurity: Not on file  Transportation Needs: Not on file  Physical Activity: Not on file  Stress: Not on file  Social Connections: Not on file  Intimate Partner Violence: Not on file    ROS Review of Systems  Constitutional: Negative for activity change, chills and fever.  HENT: Negative for congestion, postnasal drip, rhinorrhea, sinus pressure and sinus pain.   Eyes: Negative.   Respiratory: Negative for cough, chest tightness, shortness of breath and wheezing.   Cardiovascular: Negative for chest pain and palpitations.  Gastrointestinal: Negative for abdominal pain, constipation, diarrhea, nausea and vomiting.  Endocrine: Negative.   Musculoskeletal: Negative for back pain and myalgias.  Skin: Negative for rash.  Allergic/Immunologic: Negative for environmental allergies.  Neurological: Negative.  Negative for dizziness, weakness and headaches.  Hematological: Negative.   Psychiatric/Behavioral: Positive for sleep disturbance. The patient is nervous/anxious.   All other systems reviewed and are negative.   Objective:   Today's Vitals   12/24/20 1403 12/24/20 1435  BP: (!) 148/91 138/82  Pulse: 69 68  Temp: (!)   97.4 F (36.3 C)   SpO2: 99%   Weight: 161 lb 4.8 oz (73.2 kg)   Height: 5' 4" (1.626 m)    Body mass index is 27.69 kg/m.   Physical Exam Vitals and nursing note reviewed.  Constitutional:      Appearance: Normal appearance. She is well-developed.  HENT:     Head: Normocephalic and atraumatic.     Nose: Nose normal.  Eyes:     Conjunctiva/sclera: Conjunctivae normal.     Pupils: Pupils are equal, round, and reactive to light.  Neck:     Vascular: No carotid bruit.  Cardiovascular:     Rate and Rhythm: Normal rate and regular rhythm.     Pulses: Normal pulses.     Heart sounds: Normal heart sounds.  Pulmonary:     Effort: Pulmonary effort is normal.      Breath sounds: Normal breath sounds.  Abdominal:     Palpations: Abdomen is soft.  Musculoskeletal:        General: Normal range of motion.     Cervical back: Normal range of motion and neck supple.  Skin:    General: Skin is warm and dry.     Capillary Refill: Capillary refill takes less than 2 seconds.  Neurological:     General: No focal deficit present.     Mental Status: She is alert and oriented to person, place, and time.  Psychiatric:        Mood and Affect: Mood normal.        Behavior: Behavior normal.        Thought Content: Thought content normal.        Judgment: Judgment normal.     Assessment & Plan:  1. Encounter to establish care Appointment today to establish new primary care provider.   2. BRCA1 positive Patient has has had preventive hysterectomy and bilateral mastectomy with breast reconstruction. She is follwed by GYN provider and plastic surgeon.   3. Generalized anxiety disorder May continue to take alprazolam 0.59m daily when needed. New prescription sent to her pharmacy today.  - ALPRAZolam (XANAX) 0.25 MG tablet; Take one tabs daily prn anxiety.  Dispense: 30 tablet; Refill: 1  4. Primary insomnia Patient may continue to take lunesta at bedtime when needed. Generally taking it two to three times per week. New prescription sent to her pharmacy today.  - eszopiclone (LUNESTA) 2 MG TABS tablet; Take 1 tablet (2 mg total) by mouth at bedtime as needed for sleep. Take immediately before bedtime  Dispense: 90 tablet; Refill: 0  Problem List Items Addressed This Visit      Other   BRCA1 positive   Encounter to establish care - Primary   Generalized anxiety disorder   Relevant Medications   ALPRAZolam (XANAX) 0.25 MG tablet   Primary insomnia   Relevant Medications   eszopiclone (LUNESTA) 2 MG TABS tablet      Outpatient Encounter Medications as of 12/24/2020  Medication Sig  . vitamin C (ASCORBIC ACID) 500 MG tablet Take 500 mg by mouth daily.   .Marland KitchenALPRAZolam (XANAX) 0.25 MG tablet Take one tabs daily prn anxiety.  . eszopiclone (LUNESTA) 2 MG TABS tablet Take 1 tablet (2 mg total) by mouth at bedtime as needed for sleep. Take immediately before bedtime  . [DISCONTINUED] ALPRAZolam (XANAX) 0.25 MG tablet Take one tabs daily prn anxiety.  . [DISCONTINUED] cholecalciferol (VITAMIN D3) 25 MCG (1000 UNIT) tablet Take 1,000 Units by mouth daily. EVERY OTHER DAY  . [  DISCONTINUED] eszopiclone (LUNESTA) 2 MG TABS tablet Take 1 tablet (2 mg total) by mouth at bedtime as needed for sleep. Take immediately before bedtime   No facility-administered encounter medications on file as of 12/24/2020.   Time spent with the patient was approximately 45  minutes. This time included reviewing progress notes, labs, imaging studies, and discussing plan for follow up.   Follow-up: Return in about 4 months (around 04/25/2021) for mood/sleep.   Heather E Boscia, NP 

## 2020-12-30 DIAGNOSIS — F5101 Primary insomnia: Secondary | ICD-10-CM | POA: Insufficient documentation

## 2020-12-30 DIAGNOSIS — F411 Generalized anxiety disorder: Secondary | ICD-10-CM | POA: Insufficient documentation

## 2020-12-30 DIAGNOSIS — Z7689 Persons encountering health services in other specified circumstances: Secondary | ICD-10-CM | POA: Insufficient documentation

## 2021-05-07 ENCOUNTER — Ambulatory Visit (INDEPENDENT_AMBULATORY_CARE_PROVIDER_SITE_OTHER): Payer: BC Managed Care – PPO | Admitting: Nurse Practitioner

## 2021-05-07 ENCOUNTER — Encounter: Payer: Self-pay | Admitting: Nurse Practitioner

## 2021-05-07 ENCOUNTER — Other Ambulatory Visit: Payer: Self-pay

## 2021-05-07 VITALS — BP 132/93 | HR 71 | Temp 98.1°F | Ht 64.0 in | Wt 161.9 lb

## 2021-05-07 DIAGNOSIS — R319 Hematuria, unspecified: Secondary | ICD-10-CM | POA: Insufficient documentation

## 2021-05-07 DIAGNOSIS — F411 Generalized anxiety disorder: Secondary | ICD-10-CM

## 2021-05-07 DIAGNOSIS — Z6827 Body mass index (BMI) 27.0-27.9, adult: Secondary | ICD-10-CM | POA: Insufficient documentation

## 2021-05-07 DIAGNOSIS — N390 Urinary tract infection, site not specified: Secondary | ICD-10-CM | POA: Insufficient documentation

## 2021-05-07 DIAGNOSIS — F5101 Primary insomnia: Secondary | ICD-10-CM

## 2021-05-07 LAB — POCT URINALYSIS DIP (CLINITEK)
Bilirubin, UA: NEGATIVE
Glucose, UA: NEGATIVE mg/dL
Ketones, POC UA: NEGATIVE mg/dL
Leukocytes, UA: NEGATIVE
Nitrite, UA: NEGATIVE
POC PROTEIN,UA: NEGATIVE
Spec Grav, UA: 1.01 (ref 1.010–1.025)
Urobilinogen, UA: 0.2 E.U./dL
pH, UA: 7 (ref 5.0–8.0)

## 2021-05-07 MED ORDER — NITROFURANTOIN MONOHYD MACRO 100 MG PO CAPS: 100.0000 mg | ORAL_CAPSULE | Freq: Two times a day (BID) | ORAL | 0 refills | Status: DC

## 2021-05-07 MED ORDER — ESZOPICLONE 2 MG PO TABS: 2.0000 mg | ORAL_TABLET | Freq: Every evening | ORAL | 1 refills | Status: DC | PRN

## 2021-05-07 MED ORDER — ALPRAZOLAM 0.25 MG PO TABS
ORAL_TABLET | ORAL | 2 refills | Status: DC
Start: 2021-05-07 — End: 2022-10-09

## 2021-05-07 NOTE — Progress Notes (Signed)
Established Patient Office Visit  Subjective:  Patient ID: Erica Burch, female    DOB: 1971-07-29  Age: 50 y.o. MRN: [id number]  CC:  Chief Complaint  Patient presents with   Follow-up    HPI Erica Burch presents for routine follow-up visit.  Today she is complaining of some urinary frequency without dysuria.  She states started a few days ago.  Did have some lower back pain at first.  Essentially resolved.  Urinary frequency keeping her awake at night.  She denies nausea, vomiting, or abdominal pain.  Denies appetite change. Patient does have history of generalized anxiety.  She will take alprazolam 0.25 mg occasionally and only when needed.  She does take Lunesta 2 mg tablets at night to help her sleep.  She does request refills for these today.  Reviewed her PDMP profile.  Her overdose risk is 110.  Her alprazolam was filled in March 2022 and ending July 2022.  She did have a 90-day prescription of Lunesta filled March 2022.  We will have her sign a controlled substance policy agreement today. The patient does see a GYN provider on routine basis.  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  She has had prophylactic hysterectomy.  She is also had prophylactic bilateral mastectomy reconstruction.  She also sees Psychiatric nurse yearly to evaluate implants and to evaluate for breast cancer. The patient has no other concerns or complaints today.  She denies chest pain, chest pressure, or shortness of breath. She denies headaches or visual disturbances. She denies abdominal pain, nausea, vomiting, or changes in bowel or bladder habits.     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    Gross hematuria    History of kidney stones    History of uterine fibroid    HSV-1 infection    Nephrolithiasis    bilateral small   Splenic artery aneurysm ([place])    small per CT 04-19-2015 by urologist, dr Jeffie Pollock   Wears contact lenses     Past Surgical History:   Procedure Laterality Date   ABDOMINAL HYSTERECTOMY  05/19/2011   BREAST RECONSTRUCTION Bilateral 09/23/2016   Procedure: BILATERAL REVISION BREAST RECONSTRUCTION WITH SILICONE IMPLANT EXCHANGE; PLACEMENT ALLODERM;  Surgeon: Irene Limbo, MD;  Location: [place];  Service: Plastics;  Laterality: Bilateral;  BILATERAL REVISION BREAST RECONSTRUCTION WITH SILICONE IMPLANT EXCHANGE PLACEMENT ALLODERM PLACEMENT   BREAST RECONSTRUCTION WITH PLACEMENT OF TISSUE EXPANDER AND FLEX HD (ACELLULAR HYDRATED DERMIS) Bilateral 02/26/2016   Procedure: BILATERAL BREAST RECONSTRUCTION WITH PLACEMENT OF TISSUE EXPANDER AND POSSIBLE ACELLULAR HYDRATED DERMIS;  Surgeon: Irene Limbo, MD;  Location: [place];  Service: Plastics;  Laterality: Bilateral;   BREAST SURGERY  2000   LUMPECTOMY-BENIGN   CYSTOSCOPY WITH RETROGRADE PYELOGRAM, URETEROSCOPY AND STENT PLACEMENT Bilateral 05/24/2015   Procedure: CYSTOSCOPY WITH BILATERAL RETROGRADE PYELOGRAM, LEFT URETEROSCOPY WITH STONE EXTRACTION AND STENT PLACEMENT;  Surgeon: Irine Seal, MD;  Location: [facility];  Service: Urology;  Laterality: Bilateral;   HOLMIUM LASER APPLICATION  02/28/931   Procedure: HOLMIUM LASER APPLICATION;  Surgeon: Irine Seal, MD;  Location: [facility];  Service: Urology;;   [place]  05/19/2011   Procedure: LAPAROSCOPY DIAGNOSTIC;  Surgeon: Terrance Mass, MD;  Location: [facility];  Service: Gynecology;  Laterality: N/A;   LIPOSUCTION WITH LIPOFILLING Bilateral 06/10/2016   Procedure: LIPOFILLING FROM ABDOMEN TO BILATERAL CHEST;  Surgeon: Irene Limbo, MD;  Location: [facility];  Service: Plastics;  Laterality: Bilateral;   LIPOSUCTION WITH LIPOFILLING Bilateral 09/23/2016   Procedure: LIPOFILLING FROM ABDOMEN TO CHEST  BILATERAL;  Surgeon: Irene Limbo, MD;  Location: [place];  Service: Plastics;  Laterality: Bilateral;    MASTECTOMY Bilateral 2017   NIPPLE SPARING MASTECTOMY Bilateral 02/26/2016   Procedure: BILATERAL PROPHYLACTIC NIPPLE SPARING MASTECTOMIES;  Surgeon: Rolm Bookbinder, MD;  Location: [place];  Service: General;  Laterality: Bilateral;   REMOVAL OF TISSUE EXPANDER AND PLACEMENT OF IMPLANT Bilateral 06/10/2016   Procedure: REMOVAL OF  BILATERAL TISSUE EXPANDERS AND PLACEMENT OF IMPLANTS;  Surgeon: Irene Limbo, MD;  Location: [place];  Service: Plastics;  Laterality: Bilateral;   SALPINGOOPHORECTOMY  05/19/2011   Procedure: SALPINGO OOPHERECTOMY;  Surgeon: Terrance Mass, MD;  Location: [facility];  Service: Gynecology;  Laterality: Bilateral;   TONSILLECTOMY     as child    Family History  Problem Relation Age of Onset   Breast cancer Mother    Diverticulosis Mother    Hypertension Father    Diabetes Father    Breast cancer Maternal Grandmother        X 2 WITH BREAST CANCER   Breast cancer Maternal Aunt    Breast cancer Paternal Grandmother     Social History   Socioeconomic History   Marital status: Married    Spouse name: Not on file   Number of children: Not on file   Years of education: Not on file   Highest education level: Not on file  Occupational History   Not on file  Tobacco Use   Smoking status: Former    Years: 5.00    Types: Cigarettes    Quit date: 04/29/1999    Years since quitting: 22.0   Smokeless tobacco: Never  Vaping Use   Vaping Use: Never used  Substance and Sexual Activity   Alcohol use: No    Alcohol/week: 0.0 standard drinks   Drug use: No   Sexual activity: Yes    Partners: Male    Birth control/protection: Surgical    Comment: 1st intercourse- 28, partners- 32, married - 43 yr s  Other Topics Concern   Not on file  Social History Narrative   Not on file   Social Determinants of Health   Financial Resource Strain: Not on file  Food Insecurity: Not on file  Transportation Needs: Not on file  Physical Activity: Not on  file  Stress: Not on file  Social Connections: Not on file  Intimate Partner Violence: Not on file    Outpatient Medications Prior to Visit  Medication Sig Dispense Refill   vitamin C (ASCORBIC ACID) 500 MG tablet Take 500 mg by mouth daily.     ALPRAZolam (XANAX) 0.25 MG tablet Take one tabs daily prn anxiety. 30 tablet 1   eszopiclone (LUNESTA) 2 MG TABS tablet Take 1 tablet (2 mg total) by mouth at bedtime as needed for sleep. Take immediately before bedtime 90 tablet 0   No facility-administered medications prior to visit.    Allergies  Allergen Reactions   Morphine And Related Itching    ROS Review of Systems  Constitutional:  Negative for activity change, chills, fatigue and fever.  HENT:  Negative for congestion, postnasal drip, rhinorrhea, sinus pressure and sinus pain.   Eyes: Negative.   Respiratory:  Negative for cough, chest tightness and wheezing.   Cardiovascular:  Negative for chest pain and palpitations.  Gastrointestinal:  Negative for constipation, diarrhea,  nausea and vomiting.  Endocrine: Negative for cold intolerance, heat intolerance, polydipsia and polyuria.  Genitourinary:  Positive for frequency and urgency.  Musculoskeletal:  Negative for arthralgias and back pain.  Skin:  Negative for rash.  Allergic/Immunologic: Negative.   Neurological:  Negative for dizziness, weakness and headaches.  Hematological: Negative.   Psychiatric/Behavioral:  Positive for sleep disturbance. The patient is nervous/anxious.      Objective:    Physical Exam Vitals and nursing note reviewed.  Constitutional:      Appearance: Normal appearance. She is well-developed.  HENT:     Head: Normocephalic and atraumatic.     Nose: Nose normal.     Mouth/Throat:     Mouth: Mucous membranes are moist.  Eyes:     Extraocular Movements: Extraocular movements intact.     Conjunctiva/sclera: Conjunctivae normal.     Pupils: Pupils are equal, round, and reactive to light.   Cardiovascular:     Rate and Rhythm: Normal rate and regular rhythm.     Pulses: Normal pulses.     Heart sounds: Normal heart sounds.  Pulmonary:     Effort: Pulmonary effort is normal.     Breath sounds: Normal breath sounds.  Abdominal:     Palpations: Abdomen is soft.  Genitourinary:    Comments: Urine sample positive for small amount of blood. Musculoskeletal:        General: Normal range of motion.     Cervical back: Normal range of motion and neck supple.  Lymphadenopathy:     Cervical: No cervical adenopathy.  Skin:    General: Skin is warm and dry.     Capillary Refill: Capillary refill takes less than 2 seconds.  Neurological:     General: No focal deficit present.     Mental Status: She is alert and oriented to person, place, and time.  Psychiatric:        Mood and Affect: Mood normal.        Behavior: Behavior normal.        Thought Content: Thought content normal.        Judgment: Judgment normal.    Today's Vitals   05/07/21 0848 05/07/21 0854  BP: (!) 142/93 (!) 132/93  Pulse: 71   Temp: 98.1 F (36.7 C)   SpO2: 99%   Weight: 161 lb 14.4 oz (73.4 kg)   Height: 5' 4" (1.626 m)    Body mass index is 27.79 kg/m.   Wt Readings from Last 3 Encounters:  05/07/21 161 lb 14.4 oz (73.4 kg)  12/24/20 161 lb 4.8 oz (73.2 kg)  09/11/20 157 lb (71.2 kg)     Health Maintenance Due  Topic Date Due   COVID-19 Vaccine (1) Never done   INFLUENZA VACCINE  04/29/2021    There are no preventive care reminders to display for this patient.  Lab Results  Component Value Date   TSH 3.03 09/11/2020   Lab Results  Component Value Date   WBC 3.8 09/11/2020   HGB 13.3 09/11/2020   HCT 38.7 09/11/2020   MCV 84.9 09/11/2020   PLT 250 09/11/2020   Lab Results  Component Value Date   NA 140 09/11/2020   K 4.5 09/11/2020   CO2 26 09/11/2020   GLUCOSE 85 09/11/2020   BUN 14 09/11/2020   CREATININE 0.78 09/11/2020   BILITOT 0.7 09/11/2020   ALKPHOS 77  07/28/2016   AST 19 09/11/2020   ALT 12 09/11/2020   PROT 6.7 09/11/2020   ALBUMIN  4.2 07/28/2016   CALCIUM 9.2 09/11/2020   ANIONGAP 4 (L) 02/19/2016   Lab Results  Component Value Date   CHOL 200 (H) 09/11/2020   Lab Results  Component Value Date   HDL 59 09/11/2020   Lab Results  Component Value Date   LDLCALC 119 (H) 09/11/2020   Lab Results  Component Value Date   TRIG 117 09/11/2020   Lab Results  Component Value Date   CHOLHDL 3.4 09/11/2020   Lab Results  Component Value Date   HGBA1C 5.2 07/06/2013      Assessment & Plan:  1. Urinary tract infection with hematuria, site unspecified Urine sample positive for small amount of blood today.  Will do Macrobid 100 mg Capsules twice daily for next 3 days.  Send urine for culture and sensitivity and adjust treatment as indicated. - POCT URINALYSIS DIP (CLINITEK) - Urine Culture - nitrofurantoin, macrocrystal-monohydrate, (MACROBID) 100 MG capsule; Take 1 capsule (100 mg total) by mouth 2 (two) times daily.  Dispense: 6 capsule; Refill: 0  2. Generalized anxiety disorder Patient may take alprazolam 0.25 mg tablets 1 time daily if needed for acute anxiety.  Patient did sign a controlled substances agreement for primary care at [facility].  New prescription was sent to her pharmacy. - ALPRAZolam (XANAX) 0.25 MG tablet; Take one tabs daily prn anxiety.  Dispense: 30 tablet; Refill: 2  3. Primary insomnia Patient may take Lunesta 2 mg tablets at night as needed for insomnia.  New prescription sent to her pharmacy. - eszopiclone (LUNESTA) 2 MG TABS tablet; Take 1 tablet (2 mg total) by mouth at bedtime as needed for sleep. Take immediately before bedtime  Dispense: 90 tablet; Refill: 1  4. Body mass index 27.0-27.9, adult Advised she consume a 1200 to 1500-calorie diet.  Recommend she consume low-fat, low-cholesterol foods.  She should gradually incorporate exercise into her daily routine.  Problem List Items Addressed  This Visit       Genitourinary   Urinary tract infection with hematuria - Primary   Relevant Medications   nitrofurantoin, macrocrystal-monohydrate, (MACROBID) 100 MG capsule   Other Relevant Orders   POCT URINALYSIS DIP (CLINITEK) (Completed)   Urine Culture     Other   Generalized anxiety disorder   Relevant Medications   ALPRAZolam (XANAX) 0.25 MG tablet   Primary insomnia   Relevant Medications   eszopiclone (LUNESTA) 2 MG TABS tablet   Body mass index 27.0-27.9, adult    Meds ordered this encounter  Medications   nitrofurantoin, macrocrystal-monohydrate, (MACROBID) 100 MG capsule    Sig: Take 1 capsule (100 mg total) by mouth 2 (two) times daily.    Dispense:  6 capsule    Refill:  0    Order Specific Question:   Supervising Provider    Answer:   Beatrice Lecher D [[id number]]   ALPRAZolam (XANAX) 0.25 MG tablet    Sig: Take one tabs daily prn anxiety.    Dispense:  30 tablet    Refill:  2    Please hold until patient is ready for refills.    Order Specific Question:   Supervising Provider    Answer:   Hali Marry [[id number]]   eszopiclone (LUNESTA) 2 MG TABS tablet    Sig: Take 1 tablet (2 mg total) by mouth at bedtime as needed for sleep. Take immediately before bedtime    Dispense:  90 tablet    Refill:  1    Please hold until patient is ready for  refills.    Order Specific Question:   Supervising Provider    Answer:   Beatrice Lecher D [[id number]]   This note was dictated using Dragon Voice Recognition Software. Rapid proofreading was performed to expedite the delivery of the information. Despite proofreading, phonetic errors will occur which are common with this voice recognition software. Please take this into consideration. If there are any concerns, please contact our office.    Follow-up: Return in about 6 months (around 11/07/2021) for anxiety.    Ronnell Freshwater, NP

## 2021-05-07 NOTE — Patient Instructions (Signed)

## 2021-05-09 ENCOUNTER — Encounter: Payer: Self-pay | Admitting: Nurse Practitioner

## 2021-05-09 LAB — URINE CULTURE

## 2021-05-09 NOTE — Progress Notes (Signed)
Please let the patient know that urine culture showed very small infection with normal flora. If she is feeling better, the three days of macrobid should be sufficient to treat infection. Thank you.  -HB

## 2021-05-15 NOTE — Progress Notes (Signed)
Please let her know that I suggest she try some OTC AZO for bladder health. This may be taken daily. If that does not improve symptoms over next week or two weeks, we should see her back. Thanks

## 2021-09-12 ENCOUNTER — Ambulatory Visit (INDEPENDENT_AMBULATORY_CARE_PROVIDER_SITE_OTHER): Payer: BC Managed Care – PPO | Admitting: Obstetrics & Gynecology

## 2021-09-12 ENCOUNTER — Encounter: Payer: Self-pay | Admitting: Obstetrics & Gynecology

## 2021-09-12 ENCOUNTER — Other Ambulatory Visit: Payer: Self-pay

## 2021-09-12 VITALS — BP 118/80 | HR 76 | Resp 16 | Ht 64.25 in | Wt 162.0 lb

## 2021-09-12 DIAGNOSIS — Z90722 Acquired absence of ovaries, bilateral: Secondary | ICD-10-CM

## 2021-09-12 DIAGNOSIS — Z90710 Acquired absence of both cervix and uterus: Secondary | ICD-10-CM | POA: Diagnosis not present

## 2021-09-12 DIAGNOSIS — R102 Pelvic and perineal pain: Secondary | ICD-10-CM | POA: Diagnosis not present

## 2021-09-12 DIAGNOSIS — Z9079 Acquired absence of other genital organ(s): Secondary | ICD-10-CM

## 2021-09-12 DIAGNOSIS — Z1501 Genetic susceptibility to malignant neoplasm of breast: Secondary | ICD-10-CM

## 2021-09-12 DIAGNOSIS — Z23 Encounter for immunization: Secondary | ICD-10-CM | POA: Diagnosis not present

## 2021-09-12 DIAGNOSIS — Z01419 Encounter for gynecological examination (general) (routine) without abnormal findings: Secondary | ICD-10-CM

## 2021-09-12 DIAGNOSIS — E8940 Asymptomatic postprocedural ovarian failure: Secondary | ICD-10-CM

## 2021-09-12 DIAGNOSIS — Z1509 Genetic susceptibility to other malignant neoplasm: Secondary | ICD-10-CM

## 2021-09-12 DIAGNOSIS — Z1502 Genetic susceptibility to malignant neoplasm of ovary: Secondary | ICD-10-CM

## 2021-09-12 DIAGNOSIS — Z9013 Acquired absence of bilateral breasts and nipples: Secondary | ICD-10-CM

## 2021-09-12 NOTE — Progress Notes (Signed)
Erica Burch [birth year] [id number]   History:    50 y.o. . G3P3L3 remarried.  Daughters 17-20-8 year old who has a 18 yo boy.  One stepdaughter 88 year old.   RP:  Established patient presenting for annual gyn exam    HPI: BrCa1 positive.  S/P prophylactic TAH/BSO 2012 and Bilateral Mastectomy 2017.  Some mild hot flushes. No pelvic pain. Will schedule a screening Pelvic US this year.  Pap Neg 07/2018.  Will repeat at 6 years. Urine and bowel movements normal.  Left breast ultrasound August 2019 showed fat necrosis.  Followed by Plastic surgeon with Breast US annually. BMI 27.59.  Will increase physical activities.  Fasting health labs here today.  Will schedule Colono this year at 56.  BD Normal in 09/2017.   Past medical history,surgical history, family history and social history were all reviewed and documented in the EPIC chart.  Gynecologic History Patient's last menstrual period was 04/28/2009.  Obstetric History OB History  Gravida Para Term Preterm AB Living  3 3 3     3   SAB IAB Ectopic Multiple Live Births               # Outcome Date GA Lbr Len/2nd Weight Sex Delivery Anes PTL Lv  3 Term     M Vag-Spont     2 Term     F Vag-Spont     1 Term     F Vag-Spont        ROS: A ROS was performed and pertinent positives and negatives are included in the history.  GENERAL: No fevers or chills. HEENT: No change in vision, no earache, sore throat or sinus congestion. NECK: No pain or stiffness. CARDIOVASCULAR: No chest pain or pressure. No palpitations. PULMONARY: No shortness of breath, cough or wheeze. GASTROINTESTINAL: No abdominal pain, nausea, vomiting or diarrhea, melena or bright red blood per rectum. GENITOURINARY: No urinary frequency, urgency, hesitancy or dysuria. MUSCULOSKELETAL: No joint or muscle pain, no back pain, no recent trauma. DERMATOLOGIC: No rash, no itching, no lesions. ENDOCRINE: No polyuria, polydipsia, no heat or cold intolerance. No recent change in  weight. HEMATOLOGICAL: No anemia or easy bruising or bleeding. NEUROLOGIC: No headache, seizures, numbness, tingling or weakness. PSYCHIATRIC: No depression, no loss of interest in normal activity or change in sleep pattern.     Exam:   BP 118/80    Pulse 76    Resp 16    Ht 5' 4.25" (1.632 m)    Wt 162 lb (73.5 kg)    LMP 04/28/2009    BMI 27.59 kg/m   Body mass index is 27.59 kg/m.  General appearance : Well developed well nourished female. No acute distress HEENT: Eyes: no retinal hemorrhage or exudates,  Neck supple, trachea midline, no carotid bruits, no thyroidmegaly Lungs: Clear to auscultation, no rhonchi or wheezes, or rib retractions  Heart: Regular rate and rhythm, no murmurs or gallops Breast:Examined in sitting and supine position were symmetrical in appearance, no palpable masses or tenderness,  no skin retraction, no nipple inversion, no nipple discharge, no skin discoloration, no axillary or supraclavicular lymphadenopathy Abdomen: no palpable masses or tenderness, no rebound or guarding Extremities: no edema or skin discoloration or tenderness  Pelvic: Vulva: Normal             Vagina: No gross lesions or discharge  Cervix/Uterus absent  Adnexa  Without masses or tenderness  Anus: Normal   Assessment/Plan:  50 y.o. female for annual exam   1.  Well female exam with routine gynecological exam BrCa1 positive.  S/P prophylactic TAH/BSO 2012 and Bilateral Mastectomy 2017.  Some mild hot flushes. No pelvic pain. Will schedule a screening Pelvic US this year.  Pap Neg 07/2018.  Will repeat at 6 years. Urine and bowel movements normal.  Left breast ultrasound August 2019 showed fat necrosis.  Followed by Plastic surgeon with Breast US annually. BMI 27.59.  Will increase physical activities.  Fasting health labs here today.  Will schedule Colono this year at 49.  BD Normal in 09/2017. - CBC - Comp Met (CMET) - Lipid Profile - TSH - Vitamin D (25 hydroxy)  2.  BRCA1-associated protein-1 tumor predisposition syndrome Screening Pelvic US scheduled. - US Transvaginal Non-OB; Future  3. S/P bilateral mastectomy  4. S/P TAH-BSO  5. Surgical menopause Well tolerated. BD Normal in 09/2017.  Coconut oil suggested for dryness with IC.  6. Flu vaccine need Flu shot given.  Other orders - Calcium Citrate-Vitamin D (CALCIUM + D PO); Take by mouth.   Princess Bruins MD, 8:49 AM 09/12/2021

## 2021-09-13 LAB — COMPREHENSIVE METABOLIC PANEL
AG Ratio: 2 (calc) (ref 1.0–2.5)
ALT: 16 U/L (ref 6–29)
AST: 17 U/L (ref 10–35)
Albumin: 4.3 g/dL (ref 3.6–5.1)
Alkaline phosphatase (APISO): 65 U/L (ref 31–125)
BUN: 10 mg/dL (ref 7–25)
CO2: 27 mmol/L (ref 20–32)
Calcium: 9.1 mg/dL (ref 8.6–10.2)
Chloride: 107 mmol/L (ref 98–110)
Creat: 0.74 mg/dL (ref 0.50–0.99)
Globulin: 2.1 g/dL (calc) (ref 1.9–3.7)
Glucose, Bld: 92 mg/dL (ref 65–99)
Potassium: 4 mmol/L (ref 3.5–5.3)
Sodium: 141 mmol/L (ref 135–146)
Total Bilirubin: 0.7 mg/dL (ref 0.2–1.2)
Total Protein: 6.4 g/dL (ref 6.1–8.1)

## 2021-09-13 LAB — LIPID PANEL
Cholesterol: 194 mg/dL (ref <200)
HDL: 53 mg/dL (ref >=50)
LDL Cholesterol (Calc): 124 mg/dL (calc) — ABNORMAL HIGH
Non-HDL Cholesterol (Calc): 141 mg/dL (calc) — ABNORMAL HIGH (ref <130)
Total CHOL/HDL Ratio: 3.7 (calc) (ref <5.0)
Triglycerides: 76 mg/dL (ref <150)

## 2021-09-13 LAB — TSH: TSH: 3.22 mIU/L

## 2021-09-13 LAB — CBC
HCT: 38.2 % (ref 35.0–45.0)
Hemoglobin: 12.8 g/dL (ref 11.7–15.5)
MCH: 28.7 pg (ref 27.0–33.0)
MCHC: 33.5 g/dL (ref 32.0–36.0)
MCV: 85.7 fL (ref 80.0–100.0)
MPV: 10.3 fL (ref 7.5–12.5)
Platelets: 264 10*3/uL (ref 140–400)
RBC: 4.46 10*6/uL (ref 3.80–5.10)
RDW: 12.5 % (ref 11.0–15.0)
WBC: 3.8 10*3/uL (ref 3.8–10.8)

## 2021-09-13 LAB — VITAMIN D 25 HYDROXY (VIT D DEFICIENCY, FRACTURES): Vit D, 25-Hydroxy: 31 ng/mL (ref 30–100)

## 2021-09-14 LAB — URINALYSIS, COMPLETE W/RFL CULTURE
Bacteria, UA: NONE SEEN /HPF
Bilirubin Urine: NEGATIVE
Glucose, UA: NEGATIVE
Hyaline Cast: NONE SEEN /LPF
Ketones, ur: NEGATIVE
Leukocyte Esterase: NEGATIVE
Nitrites, Initial: NEGATIVE
Protein, ur: NEGATIVE
Specific Gravity, Urine: 1.01 (ref 1.001–1.035)
WBC, UA: NONE SEEN /HPF (ref 0–5)
pH: 7 (ref 5.0–8.0)

## 2021-09-14 LAB — URINE CULTURE
MICRO NUMBER:: 12761716
Result:: NO GROWTH
SPECIMEN QUALITY:: ADEQUATE

## 2021-09-14 LAB — CULTURE INDICATED

## 2021-10-03 DIAGNOSIS — Z1501 Genetic susceptibility to malignant neoplasm of breast: Secondary | ICD-10-CM | POA: Diagnosis not present

## 2021-10-03 DIAGNOSIS — Z9013 Acquired absence of bilateral breasts and nipples: Secondary | ICD-10-CM | POA: Diagnosis not present

## 2021-10-03 DIAGNOSIS — Z1509 Genetic susceptibility to other malignant neoplasm: Secondary | ICD-10-CM | POA: Diagnosis not present

## 2021-10-17 ENCOUNTER — Other Ambulatory Visit: Payer: BC Managed Care – PPO

## 2021-10-17 ENCOUNTER — Other Ambulatory Visit: Payer: BC Managed Care – PPO | Admitting: Obstetrics & Gynecology

## 2021-12-15 ENCOUNTER — Other Ambulatory Visit: Payer: Self-pay | Admitting: Nurse Practitioner

## 2021-12-15 DIAGNOSIS — F5101 Primary insomnia: Secondary | ICD-10-CM

## 2021-12-16 NOTE — Telephone Encounter (Signed)
Refilled this time only. She needs to be seen for further refills. Sent to [facility] pharmacy  ?

## 2022-05-27 ENCOUNTER — Other Ambulatory Visit: Payer: Self-pay | Admitting: Nurse Practitioner

## 2022-05-27 DIAGNOSIS — F411 Generalized anxiety disorder: Secondary | ICD-10-CM

## 2022-05-27 DIAGNOSIS — F5101 Primary insomnia: Secondary | ICD-10-CM

## 2022-06-09 ENCOUNTER — Other Ambulatory Visit: Payer: Self-pay

## 2022-06-09 DIAGNOSIS — Z1501 Genetic susceptibility to malignant neoplasm of breast: Secondary | ICD-10-CM

## 2022-07-06 DIAGNOSIS — M545 Low back pain, unspecified: Secondary | ICD-10-CM | POA: Diagnosis not present

## 2022-09-16 ENCOUNTER — Other Ambulatory Visit: Payer: BC Managed Care – PPO

## 2022-09-16 ENCOUNTER — Other Ambulatory Visit: Payer: BC Managed Care – PPO | Admitting: Obstetrics & Gynecology

## 2022-09-18 ENCOUNTER — Ambulatory Visit: Payer: BC Managed Care – PPO | Admitting: Obstetrics & Gynecology

## 2022-10-02 ENCOUNTER — Ambulatory Visit (INDEPENDENT_AMBULATORY_CARE_PROVIDER_SITE_OTHER): Payer: BC Managed Care – PPO | Admitting: Obstetrics & Gynecology

## 2022-10-02 ENCOUNTER — Ambulatory Visit (INDEPENDENT_AMBULATORY_CARE_PROVIDER_SITE_OTHER): Payer: BC Managed Care – PPO

## 2022-10-02 ENCOUNTER — Encounter: Payer: Self-pay | Admitting: Obstetrics & Gynecology

## 2022-10-02 ENCOUNTER — Ambulatory Visit: Payer: BC Managed Care – PPO | Admitting: Obstetrics & Gynecology

## 2022-10-02 VITALS — BP 118/80 | HR 87

## 2022-10-02 DIAGNOSIS — Z90722 Acquired absence of ovaries, bilateral: Secondary | ICD-10-CM

## 2022-10-02 DIAGNOSIS — Z1501 Genetic susceptibility to malignant neoplasm of breast: Secondary | ICD-10-CM | POA: Diagnosis not present

## 2022-10-02 DIAGNOSIS — Z90710 Acquired absence of both cervix and uterus: Secondary | ICD-10-CM

## 2022-10-02 DIAGNOSIS — Z1509 Genetic susceptibility to other malignant neoplasm: Secondary | ICD-10-CM | POA: Diagnosis not present

## 2022-10-02 DIAGNOSIS — Z9079 Acquired absence of other genital organ(s): Secondary | ICD-10-CM

## 2022-10-02 NOTE — Progress Notes (Signed)
    Erica Burch 03-07-1971 [id number]        51 y.o.  G3P3003   RP: BrCa1 positive for screening Pelvic US  HPI: BrCa1 positive.  S/P TAH/BSO in 2012.  No pelvic pain.  No complaint.  Prophylactic bilateral mastectomy in 2017.   OB History  Gravida Para Term Preterm AB Living  _0 SAB IAB Ectopic Multiple Live Births               # Outcome Date GA Lbr Len/2nd Weight Sex Delivery Anes PTL Lv  3 Term     M Vag-Spont     2 Term     F Vag-Spont     1 Term     F Vag-Spont       Past medical history,surgical history, problem list, medications, allergies, family history and social history were all reviewed and documented in the EPIC chart.   Directed ROS with pertinent positives and negatives documented in the history of present illness/assessment and plan.  Exam:  Vitals:   10/02/22 1118  BP: 118/80  Pulse: 87  SpO2: 99%   General appearance:  Normal  Pelvic US today: T/V images.  No latex allergy.  Cervix and uterus are surgically absent.  Vaginal cuff is normal.  Ovaries and tubes are surgically absent.  No free fluid in the pelvis.   Assessment/Plan:  52 y.o. G3P3003   1. BRCA1 positive BrCa1 positive.  S/P TAH/BSO in 2012.  No pelvic pain.  No complaint.  Prophylactic bilateral mastectomy in 2017.  Screening Pelvic US Negative today.  Patient reassured.  F/U next week for annual gyn exam.  2. S/P TAH-BSO    Princess Bruins MD, 11:38 AM 10/02/2022

## 2022-10-07 ENCOUNTER — Encounter: Payer: Self-pay | Admitting: Obstetrics & Gynecology

## 2022-10-07 ENCOUNTER — Ambulatory Visit (INDEPENDENT_AMBULATORY_CARE_PROVIDER_SITE_OTHER): Payer: BC Managed Care – PPO | Admitting: Obstetrics & Gynecology

## 2022-10-07 ENCOUNTER — Other Ambulatory Visit (HOSPITAL_COMMUNITY)
Admission: RE | Admit: 2022-10-07 | Discharge: 2022-10-07 | Disposition: A | Discharge status: Home / Self Care | Payer: BC Managed Care – PPO | Source: Ambulatory Visit | Attending: Obstetrics & Gynecology | Admitting: Obstetrics & Gynecology

## 2022-10-07 VITALS — BP 124/82 | HR 74 | Ht 64.75 in | Wt 151.0 lb

## 2022-10-07 DIAGNOSIS — Z01419 Encounter for gynecological examination (general) (routine) without abnormal findings: Secondary | ICD-10-CM | POA: Insufficient documentation

## 2022-10-07 DIAGNOSIS — Z9013 Acquired absence of bilateral breasts and nipples: Secondary | ICD-10-CM

## 2022-10-07 DIAGNOSIS — Z1501 Genetic susceptibility to malignant neoplasm of breast: Secondary | ICD-10-CM

## 2022-10-07 DIAGNOSIS — Z23 Encounter for immunization: Secondary | ICD-10-CM | POA: Diagnosis not present

## 2022-10-07 DIAGNOSIS — R102 Pelvic and perineal pain: Secondary | ICD-10-CM

## 2022-10-07 DIAGNOSIS — Z90710 Acquired absence of both cervix and uterus: Secondary | ICD-10-CM | POA: Diagnosis not present

## 2022-10-07 LAB — URINALYSIS, COMPLETE W/RFL CULTURE
Bacteria, UA: NONE SEEN /HPF
Bilirubin Urine: NEGATIVE
Glucose, UA: NEGATIVE
Hyaline Cast: NONE SEEN /LPF
Ketones, ur: NEGATIVE
Leukocyte Esterase: NEGATIVE
Nitrites, Initial: NEGATIVE
Protein, ur: NEGATIVE
RBC / HPF: NONE SEEN /HPF (ref 0–2)
Specific Gravity, Urine: 1.02 (ref 1.001–1.035)
WBC, UA: NONE SEEN /HPF (ref 0–5)
pH: 7 (ref 5.0–8.0)

## 2022-10-07 LAB — NO CULTURE INDICATED

## 2022-10-07 NOTE — Progress Notes (Signed)
Erica Burch 04-16-1971 [id number]   History:    52 y.o. G3P3L3 remarried.  Daughters 18-21-79 year old who has a 68 yo boy.  One stepdaughter 50 year old.   RP:  Established patient presenting for annual gyn exam    HPI: BrCa1 positive.  S/P prophylactic TAH/BSO 2012 and Bilateral Mastectomy 2017.  Some mild hot flushes. No pelvic pain. Pelvic US Negative 10/02/22.  Pap Neg 07/2018. Pap reflex today.  Repeat q5 yrs.  Suprapubic pressure after passing urine. Bowel movements normal. BMI 25.32.  Regular physical activities.  Fasting health labs with Fam NP.  Will schedule Colono this year at 67.  BD Normal in 09/2017. Flu vaccine given today.   Past medical history,surgical history, family history and social history were all reviewed and documented in the EPIC chart.  Gynecologic History Patient's last menstrual period was 04/28/2009.  Obstetric History OB History  Gravida Para Term Preterm AB Living  '3 3 3     3  '$ SAB IAB Ectopic Multiple Live Births               # Outcome Date GA Lbr Len/2nd Weight Sex Delivery Anes PTL Lv  3 Term     M Vag-Spont     2 Term     F Vag-Spont     1 Term     F Vag-Spont        ROS: A ROS was performed and pertinent positives and negatives are included in the history. GENERAL: No fevers or chills. HEENT: No change in vision, no earache, sore throat or sinus congestion. NECK: No pain or stiffness. CARDIOVASCULAR: No chest pain or pressure. No palpitations. PULMONARY: No shortness of breath, cough or wheeze. GASTROINTESTINAL: No abdominal pain, nausea, vomiting or diarrhea, melena or bright red blood per rectum. GENITOURINARY: No urinary frequency, urgency, hesitancy or dysuria. MUSCULOSKELETAL: No joint or muscle pain, no back pain, no recent trauma. DERMATOLOGIC: No rash, no itching, no lesions. ENDOCRINE: No polyuria, polydipsia, no heat or cold intolerance. No recent change in weight. HEMATOLOGICAL: No anemia or easy bruising or bleeding. NEUROLOGIC:  No headache, seizures, numbness, tingling or weakness. PSYCHIATRIC: No depression, no loss of interest in normal activity or change in sleep pattern.     Exam:   BP 124/82   Pulse 74   Ht 5' 4.75" (1.645 m)   Wt 151 lb (68.5 kg)   LMP 04/28/2009   SpO2 97%   BMI 25.32 kg/m   Body mass index is 25.32 kg/m.  General appearance : Well developed well nourished female. No acute distress HEENT: Eyes: no retinal hemorrhage or exudates,  Neck supple, trachea midline, no carotid bruits, no thyroidmegaly Lungs: Clear to auscultation, no rhonchi or wheezes, or rib retractions  Heart: Regular rate and rhythm, no murmurs or gallops Breasts S/P bilateral mastectomy/reconstruction. No palpable masses or tenderness,  no skin retraction, no nipple inversion, no nipple discharge, no skin discoloration, no axillary or supraclavicular lymphadenopathy Abdomen: no palpable masses or tenderness, no rebound or guarding Extremities: no edema or skin discoloration or tenderness  Pelvic: Vulva: Normal             Vagina: No gross lesions or discharge.  Pap reflex done.  Cervix/uterus absent  Adnexa  Without masses or tenderness  Anus: Normal  U/A Negative   Assessment/Plan:  52 y.o. female for annual exam   1. Encounter for routine gynecological examination with Papanicolaou smear of cervix BrCa1 positive.  S/P prophylactic TAH/BSO 2012 and Bilateral Mastectomy 2017.  Some mild hot flushes. No pelvic pain. Pelvic US Negative 10/02/22.  Pap Neg 07/2018. Pap reflex today.  Repeat q5 yrs.  Suprapubic pressure after passing urine. Bowel movements normal. BMI 25.32.  Regular physical activities.  Fasting health labs with Fam NP.  Will schedule Colono this year at 89.  BD Normal in 09/2017. Flu vaccine given today.  - Cytology - PAP( [place])  2. BRCA1 positive  3. S/P TAH-BSO  4. S/P bilateral mastectomy  5. Pelvic pressure in female U/A Negative. - Urinalysis,Complete w/RFL Culture  6. Need for  immunization against influenza - Flu Vaccine QUAD 74moIM (Fluarix, Fluzone & Alfiuria Quad PF)  Other orders - REFLEXIVE URINE CULTURE   MPrincess BruinsMD, 2:55 PM

## 2022-10-08 DIAGNOSIS — Z1509 Genetic susceptibility to other malignant neoplasm: Secondary | ICD-10-CM | POA: Diagnosis not present

## 2022-10-08 DIAGNOSIS — Z1501 Genetic susceptibility to malignant neoplasm of breast: Secondary | ICD-10-CM | POA: Diagnosis not present

## 2022-10-08 DIAGNOSIS — Z9013 Acquired absence of bilateral breasts and nipples: Secondary | ICD-10-CM | POA: Diagnosis not present

## 2022-10-08 LAB — CYTOLOGY - PAP: Diagnosis: NEGATIVE

## 2022-10-09 ENCOUNTER — Ambulatory Visit (INDEPENDENT_AMBULATORY_CARE_PROVIDER_SITE_OTHER): Payer: BC Managed Care – PPO | Admitting: Nurse Practitioner

## 2022-10-09 ENCOUNTER — Encounter: Payer: Self-pay | Admitting: Nurse Practitioner

## 2022-10-09 VITALS — BP 144/106 | HR 75 | Resp 18 | Ht 64.0 in | Wt 151.0 lb

## 2022-10-09 DIAGNOSIS — F5101 Primary insomnia: Secondary | ICD-10-CM | POA: Diagnosis not present

## 2022-10-09 DIAGNOSIS — F411 Generalized anxiety disorder: Secondary | ICD-10-CM

## 2022-10-09 DIAGNOSIS — Z1211 Encounter for screening for malignant neoplasm of colon: Secondary | ICD-10-CM

## 2022-10-09 DIAGNOSIS — Z0001 Encounter for general adult medical examination with abnormal findings: Secondary | ICD-10-CM

## 2022-10-09 DIAGNOSIS — Z0000 Encounter for general adult medical examination without abnormal findings: Secondary | ICD-10-CM

## 2022-10-09 MED ORDER — ALPRAZOLAM 0.25 MG PO TABS: ORAL_TABLET | ORAL | 3 refills | Status: DC

## 2022-10-09 MED ORDER — ESZOPICLONE 3 MG PO TABS: 3.0000 mg | ORAL_TABLET | Freq: Every evening | ORAL | 3 refills | Status: DC | PRN

## 2022-10-09 NOTE — Progress Notes (Signed)
Complete physical exam   Patient: Erica Burch   DOB: Jul 09, 1971   52 y.o. Female  MRN: [id number] Visit Date: 10/09/2022    Chief Complaint  Patient presents with   Annual Exam    Fasting    Subjective    Erica Burch is a 52 y.o. female who presents today for a complete physical exam.  She reports consuming a  generally healthy  diet.  She generally feels well. She does not have additional problems to discuss today.   HPI HPI     Annual Exam    Additional comments: Fasting       Last edited by Gemma Payor, CMA on 10/09/2022 11:01 AM.      Annual physical -due for routine, fasting labs  -history of anxiety and difficulty sleeping.  --needs refills of her alprazolam and lunesta today.  She denies chest pain, chest pressure, or shortness of breath. She denies headaches or visual disturbances. She denies abdominal pain, nausea, vomiting, or changes in bowel or bladder habits.    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    Gross hematuria    History of kidney stones    History of uterine fibroid    HSV-1 infection    Nephrolithiasis    bilateral small   Splenic artery aneurysm ([place])    small per CT 04-19-2015 by urologist, dr Jeffie Pollock   Wears contact lenses    Past Surgical History:  Procedure Laterality Date   ABDOMINAL HYSTERECTOMY  05/19/2011   BREAST RECONSTRUCTION Bilateral 09/23/2016   Procedure: BILATERAL REVISION BREAST RECONSTRUCTION WITH SILICONE IMPLANT EXCHANGE; PLACEMENT ALLODERM;  Surgeon: Irene Limbo, MD;  Location: [facility];  Service: Plastics;  Laterality: Bilateral;  BILATERAL REVISION BREAST RECONSTRUCTION WITH SILICONE IMPLANT EXCHANGE PLACEMENT ALLODERM PLACEMENT   BREAST RECONSTRUCTION WITH PLACEMENT OF TISSUE EXPANDER AND FLEX HD (ACELLULAR HYDRATED DERMIS) Bilateral 02/26/2016   Procedure: BILATERAL BREAST RECONSTRUCTION WITH PLACEMENT OF TISSUE EXPANDER AND POSSIBLE ACELLULAR  HYDRATED DERMIS;  Surgeon: Irene Limbo, MD;  Location: [facility];  Service: Plastics;  Laterality: Bilateral;   BREAST SURGERY  2000   LUMPECTOMY-BENIGN   CYSTOSCOPY WITH RETROGRADE PYELOGRAM, URETEROSCOPY AND STENT PLACEMENT Bilateral 05/24/2015   Procedure: CYSTOSCOPY WITH BILATERAL RETROGRADE PYELOGRAM, LEFT URETEROSCOPY WITH STONE EXTRACTION AND STENT PLACEMENT;  Surgeon: Irine Seal, MD;  Location: [facility];  Service: Urology;  Laterality: Bilateral;   HOLMIUM LASER APPLICATION  123456   Procedure: HOLMIUM LASER APPLICATION;  Surgeon: Irine Seal, MD;  Location: [facility];  Service: Urology;;   [place]  05/19/2011   Procedure: LAPAROSCOPY DIAGNOSTIC;  Surgeon: Terrance Mass, MD;  Location: [facility];  Service: Gynecology;  Laterality: N/A;   LIPOSUCTION WITH LIPOFILLING Bilateral 06/10/2016   Procedure: LIPOFILLING FROM ABDOMEN TO BILATERAL CHEST;  Surgeon: Irene Limbo, MD;  Location: [place];  Service: Plastics;  Laterality: Bilateral;   LIPOSUCTION WITH LIPOFILLING Bilateral 09/23/2016   Procedure: LIPOFILLING FROM ABDOMEN TO CHEST  BILATERAL;  Surgeon: Irene Limbo, MD;  Location: [place];  Service: Plastics;  Laterality: Bilateral;   MASTECTOMY Bilateral 2017   NIPPLE SPARING MASTECTOMY Bilateral 02/26/2016   Procedure: BILATERAL PROPHYLACTIC NIPPLE SPARING MASTECTOMIES;  Surgeon: Rolm Bookbinder, MD;  Location: [place];  Service: General;  Laterality: Bilateral;   REMOVAL OF TISSUE EXPANDER AND PLACEMENT OF IMPLANT Bilateral 06/10/2016   Procedure: REMOVAL OF  BILATERAL TISSUE  EXPANDERS AND PLACEMENT OF IMPLANTS;  Surgeon: Irene Limbo, MD;  Location: [place];  Service: Plastics;  Laterality: Bilateral;   SALPINGOOPHORECTOMY  05/19/2011   Procedure: SALPINGO OOPHERECTOMY;  Surgeon: Terrance Mass, MD;  Location: [facility];  Service: Gynecology;   Laterality: Bilateral;   TONSILLECTOMY     as child   Social History   Socioeconomic History   Marital status: Married    Spouse name: Not on file   Number of children: Not on file   Years of education: Not on file   Highest education level: Not on file  Occupational History   Not on file  Tobacco Use   Smoking status: Former    Years: 5.00    Types: Cigarettes    Quit date: 04/29/1999    Years since quitting: 23.5   Smokeless tobacco: Never  Vaping Use   Vaping Use: Never used  Substance and Sexual Activity   Alcohol use: No    Alcohol/week: 0.0 standard drinks of alcohol   Drug use: No   Sexual activity: Yes    Partners: Male    Birth control/protection: Surgical    Comment: 1st intercourse- 71, partners- 19, hysterectomy  Other Topics Concern   Not on file  Social History Narrative   Not on file   Social Determinants of Health   Financial Resource Strain: Not on file  Food Insecurity: Not on file  Transportation Needs: Not on file  Physical Activity: Not on file  Stress: Not on file  Social Connections: Not on file  Intimate Partner Violence: Not on file   Family Status  Relation Name Status   Mother  (Not Specified)   Father  (Not Specified)   MGM  (Not Specified)   Mat Aunt  (Not Specified)   PGM  (Not Specified)   Family History  Problem Relation Age of Onset   Breast cancer Mother    Diverticulosis Mother    Hypertension Father    Diabetes Father    Breast cancer Maternal Grandmother        X 2 WITH BREAST CANCER   Breast cancer Maternal Aunt    Breast cancer Paternal Grandmother    Allergies  Allergen Reactions   Morphine And Related Itching    Patient Care Team: Ronnell Freshwater, NP as PCP - General (Family Medicine)   Medications: Outpatient Medications Prior to Visit  Medication Sig   Calcium Citrate-Vitamin D (CALCIUM + D PO) Take by mouth.   vitamin C (ASCORBIC ACID) 500 MG tablet Take 500 mg by mouth daily.   [DISCONTINUED]  ALPRAZolam (XANAX) 0.25 MG tablet Take one tabs daily prn anxiety.   [DISCONTINUED] eszopiclone (LUNESTA) 2 MG TABS tablet TAKE ONE TABLET BY MOUTH IMMEDIATELY BEFORE BEDTIME AS NEEDED FOR SLEEP   No facility-administered medications prior to visit.    Review of Systems  Constitutional:  Negative for activity change, appetite change, chills, fatigue and fever.  HENT:  Negative for congestion, postnasal drip, rhinorrhea, sinus pressure, sinus pain, sneezing and sore throat.   Eyes: Negative.   Respiratory:  Negative for cough, chest tightness, shortness of breath and wheezing.   Cardiovascular:  Negative for chest pain and palpitations.  Gastrointestinal:  Negative for abdominal pain, constipation, diarrhea, nausea and vomiting.  Endocrine: Negative for cold intolerance, heat intolerance, polydipsia and polyuria.  Genitourinary:  Negative for dyspareunia, dysuria, flank pain, frequency and urgency.  Musculoskeletal:  Negative for arthralgias, back pain and myalgias.  Skin:  Negative for  rash.  Allergic/Immunologic: Negative for environmental allergies.  Neurological:  Negative for dizziness, weakness and headaches.  Hematological:  Negative for adenopathy.  Psychiatric/Behavioral:  Positive for sleep disturbance. The patient is nervous/anxious.     Last CBC Lab Results  Component Value Date   WBC 3.6 10/09/2022   HGB 13.7 10/09/2022   HCT 39.3 10/09/2022   MCV 85 10/09/2022   MCH 29.5 10/09/2022   RDW 12.5 10/09/2022   PLT 276 [id number]   Last metabolic panel Lab Results  Component Value Date   GLUCOSE 93 10/09/2022   NA 142 10/09/2022   K 4.0 10/09/2022   CL 104 10/09/2022   CO2 24 10/09/2022   BUN 11 10/09/2022   CREATININE 0.80 10/09/2022   EGFR 90 10/09/2022   CALCIUM 9.3 10/09/2022   PROT 6.9 10/09/2022   ALBUMIN 4.5 10/09/2022   LABGLOB 2.4 10/09/2022   AGRATIO 1.9 10/09/2022   BILITOT 0.5 10/09/2022   ALKPHOS 78 10/09/2022   AST 23 10/09/2022   ALT 15  10/09/2022   ANIONGAP 4 (L) 02/19/2016   Last lipids Lab Results  Component Value Date   CHOL 190 10/09/2022   HDL 68 10/09/2022   LDLCALC 113 (H) 10/09/2022   TRIG 45 10/09/2022   CHOLHDL 2.8 10/09/2022   Last hemoglobin A1c Lab Results  Component Value Date   HGBA1C 5.1 10/09/2022   Last thyroid functions Lab Results  Component Value Date   TSH 2.820 10/09/2022   Last vitamin D Lab Results  Component Value Date   VD25OH 31 09/12/2021       Objective     Today's Vitals   10/09/22 1101  BP: (Abnormal) 144/106  Pulse: 75  Resp: 18  SpO2: 100%  Weight: 151 lb (68.5 kg)  Height: 5' 4"$  (1.626 m)   Body mass index is 25.92 kg/m.  BP Readings from Last 3 Encounters:  10/09/22 (Abnormal) 144/106  10/07/22 124/82  10/02/22 118/80    Wt Readings from Last 3 Encounters:  10/09/22 151 lb (68.5 kg)  10/07/22 151 lb (68.5 kg)  09/12/21 162 lb (73.5 kg)     Physical Exam Vitals and nursing note reviewed.  Constitutional:      Appearance: Normal appearance. She is well-developed.  HENT:     Head: Normocephalic and atraumatic.     Right Ear: Tympanic membrane, ear canal and external ear normal.     Left Ear: Tympanic membrane, ear canal and external ear normal.     Nose: Nose normal.     Mouth/Throat:     Mouth: Mucous membranes are moist.     Pharynx: Oropharynx is clear.  Eyes:     Extraocular Movements: Extraocular movements intact.     Conjunctiva/sclera: Conjunctivae normal.     Pupils: Pupils are equal, round, and reactive to light.  Cardiovascular:     Rate and Rhythm: Normal rate and regular rhythm.     Pulses: Normal pulses.     Heart sounds: Normal heart sounds.  Pulmonary:     Effort: Pulmonary effort is normal.     Breath sounds: Normal breath sounds.  Abdominal:     General: Bowel sounds are normal. There is no distension.     Palpations: Abdomen is soft. There is no mass.     Tenderness: There is no abdominal tenderness. There is no right  CVA tenderness, left CVA tenderness, guarding or rebound.     Hernia: No hernia is present.  Musculoskeletal:  General: Normal range of motion.     Cervical back: Normal range of motion and neck supple.  Lymphadenopathy:     Cervical: No cervical adenopathy.  Skin:    General: Skin is warm and dry.     Capillary Refill: Capillary refill takes less than 2 seconds.  Neurological:     General: No focal deficit present.     Mental Status: She is alert and oriented to person, place, and time.  Psychiatric:        Mood and Affect: Mood normal.        Behavior: Behavior normal.        Thought Content: Thought content normal.        Judgment: Judgment normal.     Last depression screening scores   Row Labels 10/09/2022   11:03 AM 05/07/2021    8:50 AM 12/24/2020    2:11 PM  PHQ 2/9 Scores   Section Header. No data exists in this row.     PHQ - 2 Score   1 0 0  PHQ- 9 Score   4 2 3   $ Last fall risk screening   Row Labels 10/09/2022   11:04 AM  Fall Risk    Section Header. No data exists in this row.   Falls in the past year?   0  Number falls in past yr:   0  Injury with Fall?   0    Results for orders placed or performed in visit on 10/09/22  Lipid panel  Result Value Ref Range   Cholesterol, Total 190 100 - 199 mg/dL   Triglycerides 45 0 - 149 mg/dL   HDL 68 >39 mg/dL   VLDL Cholesterol Cal 9 5 - 40 mg/dL   LDL Chol Calc (NIH) 113 (H) 0 - 99 mg/dL   Chol/HDL Ratio 2.8 0.0 - 4.4 ratio  HgB A1c  Result Value Ref Range   Hgb A1c MFr Bld 5.1 4.8 - 5.6 %   Est. average glucose Bld gHb Est-mCnc 100 mg/dL  CBC w/Diff  Result Value Ref Range   WBC 3.6 3.4 - 10.8 x10E3/uL   RBC 4.64 3.77 - 5.28 x10E6/uL   Hemoglobin 13.7 11.1 - 15.9 g/dL   Hematocrit 39.3 34.0 - 46.6 %   MCV 85 79 - 97 fL   MCH 29.5 26.6 - 33.0 pg   MCHC 34.9 31.5 - 35.7 g/dL   RDW 12.5 11.7 - 15.4 %   Platelets 276 150 - 450 x10E3/uL   Neutrophils 48 Not Estab. %   Lymphs 42 Not Estab. %    Monocytes 8 Not Estab. %   Eos 1 Not Estab. %   Basos 1 Not Estab. %   Neutrophils Absolute 1.8 1.4 - 7.0 x10E3/uL   Lymphocytes Absolute 1.5 0.7 - 3.1 x10E3/uL   Monocytes Absolute 0.3 0.1 - 0.9 x10E3/uL   EOS (ABSOLUTE) 0.0 0.0 - 0.4 x10E3/uL   Basophils Absolute 0.0 0.0 - 0.2 x10E3/uL   Immature Granulocytes 0 Not Estab. %   Immature Grans (Abs) 0.0 0.0 - 0.1 x10E3/uL  Comp Met (CMET)  Result Value Ref Range   Glucose 93 70 - 99 mg/dL   BUN 11 6 - 24 mg/dL   Creatinine, Ser 0.80 0.57 - 1.00 mg/dL   eGFR 90 >59 mL/min/1.73   BUN/Creatinine Ratio 14 9 - 23   Sodium 142 134 - 144 mmol/L   Potassium 4.0 3.5 - 5.2 mmol/L   Chloride 104 96 - 106 mmol/L  CO2 24 20 - 29 mmol/L   Calcium 9.3 8.7 - 10.2 mg/dL   Total Protein 6.9 6.0 - 8.5 g/dL   Albumin 4.5 3.9 - 4.9 g/dL   Globulin, Total 2.4 1.5 - 4.5 g/dL   Albumin/Globulin Ratio 1.9 1.2 - 2.2   Bilirubin Total 0.5 0.0 - 1.2 mg/dL   Alkaline Phosphatase 78 44 - 121 IU/L   AST 23 0 - 40 IU/L   ALT 15 0 - 32 IU/L  TSH  Result Value Ref Range   TSH 2.820 0.450 - 4.500 uIU/mL    Assessment & Plan    1. Encounter for general adult medical examination with abnormal findings Annual phsysical today   2. Generalized anxiety disorder Ok to continue alprazolam 0.25 mg daily if needed for acute anxiety. New prescription sent today  - ALPRAZolam (XANAX) 0.25 MG tablet; Take one tabs daily prn anxiety.  Dispense: 30 tablet; Refill: 3  3. Primary insomnia May take lunesta 3 mg at bedtime as needed for acute insomnia. New prescription sent to her pharmacy today.  - eszopiclone 3 MG TABS; Take 1 tablet (3 mg total) by mouth at bedtime as needed for sleep. Take immediately before bedtime  Dispense: 30 tablet; Refill: 3  4. Screening for colon cancer .refer to GI for screening colonoscopy   - Ambulatory referral to Gastroenterology  5. Health maintenance examination Routine, fasting labs drawn during today's visit.  - TSH; Future -  Comp Met (CMET); Future - CBC w/Diff; Future - HgB A1c; Future - Lipid panel; Future - Lipid panel - HgB A1c - CBC w/Diff - Comp Met (CMET) - TSH    Immunization History  Administered Date(s) Administered   Influenza Split 07/02/2012   Influenza,inj,Quad PF,6+ Mos 07/06/2013, 07/28/2016, 07/29/2017, 07/30/2018, 09/08/2019, 09/11/2020, 09/12/2021, 10/07/2022   Tdap 07/02/2012    Health Maintenance  Topic Date Due   COVID-19 Vaccine (1) Never done   HIV Screening  Never done   Hepatitis C Screening  Never done   COLONOSCOPY (Pts 45-68yr Insurance coverage will need to be confirmed)  Never done   Zoster Vaccines- Shingrix (1 of 2) Never done   DTaP/Tdap/Td (2 - Td or Tdap) 07/02/2022   INFLUENZA VACCINE  Completed   HPV VACCINES  Aged Out   MAMMOGRAM  Discontinued   PAP SMEAR-Modifier  Discontinued    Discussed health benefits of physical activity, and encouraged her to engage in regular exercise appropriate for her age and condition.  Problem List Items Addressed This Visit       Other   Generalized anxiety disorder   Relevant Medications   ALPRAZolam (XANAX) 0.25 MG tablet   Primary insomnia   Relevant Medications   eszopiclone 3 MG TABS   Other Visit Diagnoses     Encounter for general adult medical examination with abnormal findings    -  Primary   Screening for colon cancer       Relevant Orders   Ambulatory referral to Gastroenterology   Health maintenance examination       Relevant Orders   TSH (Completed)   Comp Met (CMET) (Completed)   CBC w/Diff (Completed)   HgB A1c (Completed)   Lipid panel (Completed)        Return in about 4 months (around 02/07/2023) for mood - 4 to 6 months .        HRonnell Freshwater NP  [phone](phone) [phone](fax)  [place]

## 2022-10-10 LAB — CBC WITH DIFFERENTIAL/PLATELET
Basophils Absolute: 0 10*3/uL (ref 0.0–0.2)
Basos: 1 %
EOS (ABSOLUTE): 0 10*3/uL (ref 0.0–0.4)
Eos: 1 %
Hematocrit: 39.3 % (ref 34.0–46.6)
Hemoglobin: 13.7 g/dL (ref 11.1–15.9)
Immature Grans (Abs): 0 10*3/uL (ref 0.0–0.1)
Immature Granulocytes: 0 %
Lymphocytes Absolute: 1.5 10*3/uL (ref 0.7–3.1)
Lymphs: 42 %
MCH: 29.5 pg (ref 26.6–33.0)
MCHC: 34.9 g/dL (ref 31.5–35.7)
MCV: 85 fL (ref 79–97)
Monocytes Absolute: 0.3 10*3/uL (ref 0.1–0.9)
Monocytes: 8 %
Neutrophils Absolute: 1.8 10*3/uL (ref 1.4–7.0)
Neutrophils: 48 %
Platelets: 276 10*3/uL (ref 150–450)
RBC: 4.64 x10E6/uL (ref 3.77–5.28)
RDW: 12.5 % (ref 11.7–15.4)
WBC: 3.6 10*3/uL (ref 3.4–10.8)

## 2022-10-10 LAB — LIPID PANEL
Chol/HDL Ratio: 2.8 ratio (ref 0.0–4.4)
Cholesterol, Total: 190 mg/dL (ref 100–199)
HDL: 68 mg/dL (ref >39)
LDL Chol Calc (NIH): 113 mg/dL — ABNORMAL HIGH (ref 0–99)
Triglycerides: 45 mg/dL (ref 0–149)
VLDL Cholesterol Cal: 9 mg/dL (ref 5–40)

## 2022-10-10 LAB — HEMOGLOBIN A1C
Est. average glucose Bld gHb Est-mCnc: 100 mg/dL
Hgb A1c MFr Bld: 5.1 % (ref 4.8–5.6)

## 2022-10-10 LAB — COMPREHENSIVE METABOLIC PANEL
ALT: 15 IU/L (ref 0–32)
AST: 23 IU/L (ref 0–40)
Albumin/Globulin Ratio: 1.9 (ref 1.2–2.2)
Albumin: 4.5 g/dL (ref 3.9–4.9)
Alkaline Phosphatase: 78 IU/L (ref 44–121)
BUN/Creatinine Ratio: 14 (ref 9–23)
BUN: 11 mg/dL (ref 6–24)
Bilirubin Total: 0.5 mg/dL (ref 0.0–1.2)
CO2: 24 mmol/L (ref 20–29)
Calcium: 9.3 mg/dL (ref 8.7–10.2)
Chloride: 104 mmol/L (ref 96–106)
Creatinine, Ser: 0.8 mg/dL (ref 0.57–1.00)
Globulin, Total: 2.4 g/dL (ref 1.5–4.5)
Glucose: 93 mg/dL (ref 70–99)
Potassium: 4 mmol/L (ref 3.5–5.2)
Sodium: 142 mmol/L (ref 134–144)
Total Protein: 6.9 g/dL (ref 6.0–8.5)
eGFR: 90 mL/min/{1.73_m2} (ref >59)

## 2022-10-10 LAB — TSH: TSH: 2.82 u[IU]/mL (ref 0.450–4.500)

## 2023-03-02 ENCOUNTER — Encounter: Payer: Self-pay | Admitting: Gastroenterology

## 2023-03-30 NOTE — Progress Notes (Signed)
Labs generally stable.  Continue to monitor.

## 2023-04-26 ENCOUNTER — Other Ambulatory Visit: Payer: Self-pay | Admitting: Nurse Practitioner

## 2023-04-26 DIAGNOSIS — F411 Generalized anxiety disorder: Secondary | ICD-10-CM

## 2023-04-26 DIAGNOSIS — F5101 Primary insomnia: Secondary | ICD-10-CM

## 2023-05-12 ENCOUNTER — Encounter: Payer: Self-pay | Admitting: Family Medicine

## 2023-05-12 ENCOUNTER — Telehealth (INDEPENDENT_AMBULATORY_CARE_PROVIDER_SITE_OTHER): Payer: BC Managed Care – PPO | Admitting: Family Medicine

## 2023-05-12 VITALS — Ht 64.0 in | Wt 154.0 lb

## 2023-05-12 DIAGNOSIS — F411 Generalized anxiety disorder: Secondary | ICD-10-CM

## 2023-05-12 DIAGNOSIS — F5101 Primary insomnia: Secondary | ICD-10-CM

## 2023-05-12 MED ORDER — SUVOREXANT 10 MG PO TABS: 1.0000 | ORAL_TABLET | Freq: Every evening | ORAL | 1 refills | Status: DC | PRN

## 2023-05-12 NOTE — Progress Notes (Addendum)
   Established Patient Office Visit  Subjective   Patient ID: Erica Burch, female    DOB: 1970-10-10  Age: 52 y.o. MRN: [id number]  Chief Complaint  Patient presents with   Medical Management of Chronic Issues   Telehealth video  encounter  Patient location: Patient's home Provider location: [facility]  Time spent: 15 minutes  HPI  GAD -patient takes Xanax daily as needed for anxiety.  No concerns with this medication.  No questions.  Has been taking it for a while with no changes in dose.  Insomnia-patient takes [place].  Had tried Ambien in the past.  Used to take melatonin as well but not in the past 6 months at least.  Patient has never tried any other medications.  Open to trying an alternative.  We discussed suvorexant.  Discussed how it may not be covered or affordable through her insurance.  Will send it in.  Patient advised if she is to take it she is to not tak her Lunesta at the same time and should hold off on her Lunesta for at least 2 days prior to starting this medication.     The 10-year ASCVD risk score (Arnett DK, et al., 2019) is: 0.9%  Health Maintenance Due  Topic Date Due   HIV Screening  Never done   Hepatitis C Screening  Never done   Colonoscopy  Never done   Zoster Vaccines- Shingrix (1 of 2) Never done   COVID-19 Vaccine (1 - 2023-24 season) Never done   DTaP/Tdap/Td (2 - Td or Tdap) 07/02/2022   INFLUENZA VACCINE  04/30/2023      Objective:     Ht 5\' 4"  (1.626 m)   Wt 154 lb (69.9 kg)   LMP 04/28/2009   BMI 26.43 kg/m    Physical Exam General: Alert, oriented Psych: Pleasant affect, spontaneous speech.  No results found for any visits on 05/12/23.      Assessment & Plan:   Primary insomnia Assessment & Plan: Discussed possibly trying something other than Lunesta.  Patient willing to try suvorexant if it is affordable. - Will send in prescription for 10 mg suvorexant - Patient told not to take both medications  together.  Advised not to take Lunesta for 2 days prior to starting suvorexant - Patient to follow-up and let us know if she decides to switch so that Alfonso Patten can be removed from her medication list   Generalized anxiety disorder Assessment & Plan: Continue Xanax daily as needed.  No concern for abuse or dependence.   Other orders -     Suvorexant; Take 1 tablet (10 mg total) by mouth at bedtime as needed.  Dispense: 30 tablet; Refill: 1     No follow-ups on file.    Sandre Kitty, MD

## 2023-05-12 NOTE — Assessment & Plan Note (Signed)
Continue Xanax daily as needed.  No concern for abuse or dependence.

## 2023-05-12 NOTE — Assessment & Plan Note (Signed)
Discussed possibly trying something other than Lunesta.  Patient willing to try suvorexant if it is affordable. - Will send in prescription for 10 mg suvorexant - Patient told not to take both medications together.  Advised not to take Lunesta for 2 days prior to starting suvorexant - Patient to follow-up and let us know if she decides to switch so that Alfonso Patten can be removed from her medication list

## 2023-05-13 MED ORDER — ALPRAZOLAM 0.25 MG PO TABS: ORAL_TABLET | ORAL | 3 refills | Status: DC

## 2023-05-14 ENCOUNTER — Other Ambulatory Visit: Payer: Self-pay | Admitting: Family Medicine

## 2023-05-14 MED ORDER — RAMELTEON 8 MG PO TABS: 8.0000 mg | ORAL_TABLET | Freq: Every day | ORAL | 1 refills | Status: DC

## 2023-05-27 ENCOUNTER — Ambulatory Visit (AMBULATORY_SURGERY_CENTER): Payer: BC Managed Care – PPO

## 2023-05-27 ENCOUNTER — Encounter: Payer: Self-pay | Admitting: Gastroenterology

## 2023-05-27 VITALS — Ht 64.0 in | Wt 156.0 lb

## 2023-05-27 DIAGNOSIS — Z1211 Encounter for screening for malignant neoplasm of colon: Secondary | ICD-10-CM

## 2023-05-27 MED ORDER — NA SULFATE-K SULFATE-MG SULF 17.5-3.13-1.6 GM/177ML PO SOLN: 1.0000 | Freq: Once | ORAL | 0 refills | Status: DC

## 2023-05-27 MED ORDER — NA SULFATE-K SULFATE-MG SULF 17.5-3.13-1.6 GM/177ML PO SOLN: 1.0000 | Freq: Once | ORAL | 0 refills | Status: AC

## 2023-05-27 NOTE — Progress Notes (Addendum)
Pre visit completed in person; Patient verified name, DOB, and address;  No egg or soy allergy known to patient;  No issues known to pt with past sedation with any surgeries or procedures; Patient denies ever being told they had issues or difficulty with intubation;  No FH of Malignant Hyperthermia; Pt is not on diet pills; Pt is not on home 02;  Pt is not on blood thinners;  Pt denies issues with constipation;  No A fib or A flutter;  Have any cardiac testing pending--NO Insurance verified during PV appt--- BCBS   Pt can ambulate without assistance;  Pt denies use of chewing tobacco; Discussed diabetic/weight loss medication holds; Discussed NSAID holds; Checked BMI to be less than 50; Pt instructed to use Single[employer] or GoodRx for a price reduction on prep;  Patient's chart reviewed by Cathlyn Parsons CNRA prior to previsit and patient appropriate for the [facility];  Pre visit completed and red dot placed by patient's name on their procedure day (on provider's schedule);    Instructions printed and given to patient during PV appt;  Good Rx [facility] coupon given to patient;

## 2023-06-08 ENCOUNTER — Ambulatory Visit (AMBULATORY_SURGERY_CENTER): Payer: BC Managed Care – PPO | Admitting: Gastroenterology

## 2023-06-08 ENCOUNTER — Encounter: Payer: Self-pay | Admitting: Gastroenterology

## 2023-06-08 VITALS — BP 103/70 | HR 57 | Temp 98.0°F | Resp 14 | Ht 64.0 in | Wt 156.0 lb

## 2023-06-08 DIAGNOSIS — Z1211 Encounter for screening for malignant neoplasm of colon: Secondary | ICD-10-CM | POA: Diagnosis not present

## 2023-06-08 DIAGNOSIS — D124 Benign neoplasm of descending colon: Secondary | ICD-10-CM

## 2023-06-08 MED ORDER — SODIUM CHLORIDE 0.9 % IV SOLN: 500.0000 mL | Freq: Once | INTRAVENOUS | Status: DC

## 2023-06-08 NOTE — Progress Notes (Signed)
History and Physical:  This patient presents for endoscopic testing for: Encounter Diagnosis  Name Primary?   Colon cancer screening Yes    Average risk for colorectal cancer.  First screening exam.  Patient denies chronic abdominal pain, rectal bleeding, constipation or diarrhea.   Patient is otherwise without complaints or active issues today.   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    Gross hematuria    History of kidney stones    History of uterine fibroid    HSV-1 infection    Nephrolithiasis    bilateral small   Splenic artery aneurysm (HCC)    small per CT 04-19-2015 by urologist, dr Annabell Howells   Wears contact lenses      Past Surgical History: Past Surgical History:  Procedure Laterality Date   ABDOMINAL HYSTERECTOMY  05/19/2011   BREAST RECONSTRUCTION Bilateral 09/23/2016   Procedure: BILATERAL REVISION BREAST RECONSTRUCTION WITH SILICONE IMPLANT EXCHANGE; PLACEMENT ALLODERM;  Surgeon: Glenna Fellows, MD;  Location: [facility];  Service: Plastics;  Laterality: Bilateral;  BILATERAL REVISION BREAST RECONSTRUCTION WITH SILICONE IMPLANT EXCHANGE PLACEMENT ALLODERM PLACEMENT   BREAST RECONSTRUCTION WITH PLACEMENT OF TISSUE EXPANDER AND FLEX HD (ACELLULAR HYDRATED DERMIS) Bilateral 02/26/2016   Procedure: BILATERAL BREAST RECONSTRUCTION WITH PLACEMENT OF TISSUE EXPANDER AND POSSIBLE ACELLULAR HYDRATED DERMIS;  Surgeon: Glenna Fellows, MD;  Location: [facility];  Service: Plastics;  Laterality: Bilateral;   BREAST SURGERY  2000   LUMPECTOMY-BENIGN   CYSTOSCOPY WITH RETROGRADE PYELOGRAM, URETEROSCOPY AND STENT PLACEMENT Bilateral 05/24/2015   Procedure: CYSTOSCOPY WITH BILATERAL RETROGRADE PYELOGRAM, LEFT URETEROSCOPY WITH STONE EXTRACTION AND STENT PLACEMENT;  Surgeon: Bjorn Pippin, MD;  Location: [facility];  Service: Urology;  Laterality: Bilateral;   HOLMIUM LASER APPLICATION   05/24/2015   Procedure: HOLMIUM LASER APPLICATION;  Surgeon: Bjorn Pippin, MD;  Location: [facility];  Service: Urology;;   LAPAROSCOPIC CHOLECYSTECTOMY  1992   LAPAROSCOPY  05/19/2011   Procedure: LAPAROSCOPY DIAGNOSTIC;  Surgeon: Ok Edwards, MD;  Location: [facility];  Service: Gynecology;  Laterality: N/A;   LIPOSUCTION WITH LIPOFILLING Bilateral 06/10/2016   Procedure: LIPOFILLING FROM ABDOMEN TO BILATERAL CHEST;  Surgeon: Glenna Fellows, MD;  Location: [facility];  Service: Plastics;  Laterality: Bilateral;   LIPOSUCTION WITH LIPOFILLING Bilateral 09/23/2016   Procedure: LIPOFILLING FROM ABDOMEN TO CHEST  BILATERAL;  Surgeon: Glenna Fellows, MD;  Location: [facility];  Service: Plastics;  Laterality: Bilateral;   NIPPLE SPARING MASTECTOMY Bilateral 02/26/2016   Procedure: BILATERAL PROPHYLACTIC NIPPLE SPARING MASTECTOMIES;  Surgeon: Emelia Loron, MD;  Location: [facility];  Service: General;  Laterality: Bilateral;   REMOVAL OF TISSUE EXPANDER AND PLACEMENT OF IMPLANT Bilateral 06/10/2016   Procedure: REMOVAL OF  BILATERAL TISSUE EXPANDERS AND PLACEMENT OF IMPLANTS;  Surgeon: Glenna Fellows, MD;  Location: [facility];  Service: Plastics;  Laterality: Bilateral;   SALPINGOOPHORECTOMY  05/19/2011   Procedure: SALPINGO OOPHERECTOMY;  Surgeon: Ok Edwards, MD;  Location: [facility];  Service: Gynecology;  Laterality: Bilateral;   TONSILLECTOMY     as child    Allergies: Allergies  Allergen Reactions   Morphine And Codeine Itching    Outpatient Meds: Current Outpatient Medications  Medication Sig Dispense Refill   ALPRAZolam (XANAX) 0.25 MG tablet Take one tabs daily prn anxiety. 30 tablet 3   eszopiclone 3 MG TABS Take 1 tablet (3 mg total) by mouth at bedtime as  needed for sleep. Take immediately before bedtime 30 tablet 3   Multiple Vitamin (MULTIVITAMIN PO) Take 1 tablet by mouth daily at 6 (six) AM.     ramelteon  (ROZEREM) 8 MG tablet Take 1 tablet (8 mg total) by mouth at bedtime. (Patient not taking: Reported on 05/27/2023) 90 tablet 1   Current Facility-Administered Medications  Medication Dose Route Frequency Provider Last Rate Last Admin   0.9 %  sodium chloride infusion  500 mL Intravenous Once Sherrilyn Rist, MD          ___________________________________________________________________ Objective   Exam:  BP (!) 140/81   Pulse 100   Temp 98 F (36.7 C)   Ht 5\' 4"  (1.626 m)   Wt 156 lb (70.8 kg)   LMP 04/28/2009   SpO2 99%   BMI 26.78 kg/m   CV: regular , S1/S2 Resp: clear to auscultation bilaterally, normal RR and effort noted GI: soft, no tenderness, with active bowel sounds.   Assessment: Encounter Diagnosis  Name Primary?   Colon cancer screening Yes     Plan: Colonoscopy   The benefits and risks of the planned procedure were described in detail with the patient or (when appropriate) their health care proxy.  Risks were outlined as including, but not limited to, bleeding, infection, perforation, adverse medication reaction leading to cardiac or pulmonary decompensation, pancreatitis (if ERCP).  The limitation of incomplete mucosal visualization was also discussed.  No guarantees or warranties were given.  The patient is appropriate for an endoscopic procedure in the ambulatory setting.   - Amada Jupiter, MD

## 2023-06-08 NOTE — Progress Notes (Signed)
Vss nad trans to pacu 

## 2023-06-08 NOTE — Progress Notes (Signed)
Pt's states no medical or surgical changes since previsit or office visit. 

## 2023-06-08 NOTE — Patient Instructions (Signed)

## 2023-06-08 NOTE — Progress Notes (Signed)
Called to room to assist during endoscopic procedure.  Patient ID and intended procedure confirmed with present staff. Received instructions for my participation in the procedure from the performing physician.  

## 2023-06-08 NOTE — Op Note (Signed)
[facility] Patient Name: Erica Burch Procedure Date: 06/08/2023 9:19 AM MRN: [id number] Endoscopist: Sherilyn Cooter L. Myrtie Neither , MD, [id number] Age: 52 Referring MD:  Date of Birth: 04-15-1971 Gender: Female Account #: [id number] Procedure:                Colonoscopy Indications:              Screening for colorectal malignant neoplasm, This                            is the patient's first colonoscopy Medicines:                Monitored Anesthesia Care Procedure:                Pre-Anesthesia Assessment:                           - Prior to the procedure, a History and Physical                            was performed, and patient medications and                            allergies were reviewed. The patient's tolerance of                            previous anesthesia was also reviewed. The risks                            and benefits of the procedure and the sedation                            options and risks were discussed with the patient.                            All questions were answered, and informed consent                            was obtained. Prior Anticoagulants: The patient has                            taken no anticoagulant or antiplatelet agents. ASA                            Grade Assessment: II - A patient with mild systemic                            disease. After reviewing the risks and benefits,                            the patient was deemed in satisfactory condition to                            undergo the procedure.  After obtaining informed consent, the colonoscope                            was passed under direct vision. Throughout the                            procedure, the patient's blood pressure, pulse, and                            oxygen saturations were monitored continuously. The                            CF [id number] #[id number] was introduced through the anus                            and advanced to  the the cecum, identified by                            appendiceal orifice and ileocecal valve. The                            colonoscopy was performed without difficulty. The                            patient tolerated the procedure well. The quality                            of the bowel preparation was good. The ileocecal                            valve, appendiceal orifice, and rectum were                            photographed. The bowel preparation used was SUPREP. Scope In: 9:32:39 AM Scope Out: 9:47:23 AM Scope Withdrawal Time: 0 hours 11 minutes 2 seconds  Total Procedure Duration: 0 hours 14 minutes 44 seconds  Findings:                 The perianal and digital rectal examinations were                            normal.                           Repeat examination of right colon under NBI                            performed.                           A diminutive polyp was found in the descending                            colon. The polyp was semi-sessile. The polyp was  removed with a cold snare. Resection and retrieval                            were complete.                           A few small-mouthed diverticula were found in the                            left colon.                           The exam was otherwise without abnormality on                            direct and retroflexion views. Complications:            No immediate complications. Estimated Blood Loss:     Estimated blood loss was minimal. Impression:               - One diminutive polyp in the descending colon,                            removed with a cold snare. Resected and retrieved.                           - Diverticulosis in the left colon.                           - The examination was otherwise normal on direct                            and retroflexion views. Recommendation:           - Patient has a contact number available for                             emergencies. The signs and symptoms of potential                            delayed complications were discussed with the                            patient. Return to normal activities tomorrow.                            Written discharge instructions were provided to the                            patient.                           - Resume previous diet.                           - Continue present medications.                           -  Await pathology results.                           - Repeat colonoscopy is recommended for                            surveillance. The colonoscopy date will be                            determined after pathology results from today's                            exam become available for review. Kainon Varady L. Myrtie Neither, MD 06/08/2023 9:50:53 AM This report has been signed electronically.

## 2023-06-09 ENCOUNTER — Telehealth: Payer: Self-pay | Admitting: *Deleted

## 2023-06-09 NOTE — Telephone Encounter (Signed)
  Follow up Call-     06/08/2023    8:45 AM  Call back number  Post procedure Call Back phone  # [phone]  Permission to leave phone message Yes     Patient questions:  Do you have a fever, pain , or abdominal swelling? No. Pain Score  0 *  Have you tolerated food without any problems? Yes.    Have you been able to return to your normal activities? Yes.    Do you have any questions about your discharge instructions: Diet   No. Medications  No. Follow up visit  No.  Do you have questions or concerns about your Care? No.  Actions: * If pain score is 4 or above: No action needed, pain <4.

## 2023-06-11 ENCOUNTER — Encounter: Payer: Self-pay | Admitting: Gastroenterology

## 2023-06-11 LAB — SURGICAL PATHOLOGY

## 2023-10-12 DIAGNOSIS — Z1509 Genetic susceptibility to other malignant neoplasm: Secondary | ICD-10-CM | POA: Diagnosis not present

## 2023-10-12 DIAGNOSIS — Z09 Encounter for follow-up examination after completed treatment for conditions other than malignant neoplasm: Secondary | ICD-10-CM | POA: Diagnosis not present

## 2023-10-12 DIAGNOSIS — Z9013 Acquired absence of bilateral breasts and nipples: Secondary | ICD-10-CM | POA: Diagnosis not present

## 2023-10-12 DIAGNOSIS — Z1501 Genetic susceptibility to malignant neoplasm of breast: Secondary | ICD-10-CM | POA: Diagnosis not present

## 2024-04-05 ENCOUNTER — Other Ambulatory Visit

## 2024-04-05 ENCOUNTER — Other Ambulatory Visit: Payer: Self-pay | Admitting: *Deleted

## 2024-04-05 DIAGNOSIS — Z1329 Encounter for screening for other suspected endocrine disorder: Secondary | ICD-10-CM | POA: Diagnosis not present

## 2024-04-05 DIAGNOSIS — Z130 Encounter for screening for diseases of the blood and blood-forming organs and certain disorders involving the immune mechanism: Secondary | ICD-10-CM

## 2024-04-05 DIAGNOSIS — E559 Vitamin D deficiency, unspecified: Secondary | ICD-10-CM

## 2024-04-05 DIAGNOSIS — Z1159 Encounter for screening for other viral diseases: Secondary | ICD-10-CM | POA: Diagnosis not present

## 2024-04-05 DIAGNOSIS — Z1321 Encounter for screening for nutritional disorder: Secondary | ICD-10-CM | POA: Diagnosis not present

## 2024-04-06 ENCOUNTER — Ambulatory Visit: Payer: Self-pay

## 2024-04-06 LAB — CBC WITH DIFFERENTIAL/PLATELET
Basophils Absolute: 0 x10E3/uL (ref 0.0–0.2)
Basos: 1 %
EOS (ABSOLUTE): 0.1 x10E3/uL (ref 0.0–0.4)
Eos: 2 %
Hematocrit: 39.9 % (ref 34.0–46.6)
Hemoglobin: 13.3 g/dL (ref 11.1–15.9)
Immature Grans (Abs): 0 x10E3/uL (ref 0.0–0.1)
Immature Granulocytes: 0 %
Lymphocytes Absolute: 1.7 x10E3/uL (ref 0.7–3.1)
Lymphs: 43 %
MCH: 29 pg (ref 26.6–33.0)
MCHC: 33.3 g/dL (ref 31.5–35.7)
MCV: 87 fL (ref 79–97)
Monocytes Absolute: 0.3 x10E3/uL (ref 0.1–0.9)
Monocytes: 8 %
Neutrophils Absolute: 1.9 x10E3/uL (ref 1.4–7.0)
Neutrophils: 46 %
Platelets: 265 x10E3/uL (ref 150–450)
RBC: 4.58 x10E6/uL (ref 3.77–5.28)
RDW: 13.1 % (ref 11.7–15.4)
WBC: 4.1 x10E3/uL (ref 3.4–10.8)

## 2024-04-06 LAB — LIPID PANEL
Chol/HDL Ratio: 3.4 ratio (ref 0.0–4.4)
Cholesterol, Total: 198 mg/dL (ref 100–199)
HDL: 58 mg/dL (ref >39)
LDL Chol Calc (NIH): 127 mg/dL — ABNORMAL HIGH (ref 0–99)
Triglycerides: 73 mg/dL (ref 0–149)
VLDL Cholesterol Cal: 13 mg/dL (ref 5–40)

## 2024-04-06 LAB — COMPREHENSIVE METABOLIC PANEL WITH GFR
ALT: 16 IU/L (ref 0–32)
AST: 20 IU/L (ref 0–40)
Albumin: 4.4 g/dL (ref 3.8–4.9)
Alkaline Phosphatase: 84 IU/L (ref 44–121)
BUN/Creatinine Ratio: 14 (ref 9–23)
BUN: 12 mg/dL (ref 6–24)
Bilirubin Total: 0.4 mg/dL (ref 0.0–1.2)
CO2: 22 mmol/L (ref 20–29)
Calcium: 9.4 mg/dL (ref 8.7–10.2)
Chloride: 106 mmol/L (ref 96–106)
Creatinine, Ser: 0.85 mg/dL (ref 0.57–1.00)
Globulin, Total: 2.3 g/dL (ref 1.5–4.5)
Glucose: 91 mg/dL (ref 70–99)
Potassium: 4.6 mmol/L (ref 3.5–5.2)
Sodium: 142 mmol/L (ref 134–144)
Total Protein: 6.7 g/dL (ref 6.0–8.5)
eGFR: 83 mL/min/1.73 (ref >59)

## 2024-04-06 LAB — HIV ANTIBODY (ROUTINE TESTING W REFLEX): HIV Screen 4th Generation wRfx: NONREACTIVE

## 2024-04-06 LAB — HEMOGLOBIN A1C
Est. average glucose Bld gHb Est-mCnc: 103 mg/dL
Hgb A1c MFr Bld: 5.2 % (ref 4.8–5.6)

## 2024-04-06 LAB — HEPATITIS C ANTIBODY: Hep C Virus Ab: NONREACTIVE

## 2024-04-06 LAB — VITAMIN D 25 HYDROXY (VIT D DEFICIENCY, FRACTURES): Vit D, 25-Hydroxy: 36.4 ng/mL (ref 30.0–100.0)

## 2024-04-06 LAB — TSH: TSH: 2.81 u[IU]/mL (ref 0.450–4.500)

## 2024-04-12 ENCOUNTER — Ambulatory Visit (INDEPENDENT_AMBULATORY_CARE_PROVIDER_SITE_OTHER)

## 2024-04-12 VITALS — BP 133/91 | HR 74 | Ht 64.0 in | Wt 161.0 lb

## 2024-04-12 DIAGNOSIS — Z23 Encounter for immunization: Secondary | ICD-10-CM

## 2024-04-12 DIAGNOSIS — Z0000 Encounter for general adult medical examination without abnormal findings: Secondary | ICD-10-CM

## 2024-04-12 DIAGNOSIS — F5101 Primary insomnia: Secondary | ICD-10-CM | POA: Diagnosis not present

## 2024-04-12 DIAGNOSIS — F411 Generalized anxiety disorder: Secondary | ICD-10-CM

## 2024-04-12 MED ORDER — ALPRAZOLAM 0.25 MG PO TABS: ORAL_TABLET | ORAL | 3 refills | Status: AC

## 2024-04-12 MED ORDER — TRAZODONE HCL 50 MG PO TABS: 50.0000 mg | ORAL_TABLET | Freq: Every day | ORAL | 0 refills | Status: AC

## 2024-04-12 NOTE — Assessment & Plan Note (Signed)
 Managing with alprazolam  0.25 mg as needed for anxiety attacks. No concern for abuse or dependence. PDMP reviewed, no aberrancies. Refill provided today.

## 2024-04-12 NOTE — Progress Notes (Signed)
 Complete physical exam  Patient: Erica Burch   DOB: 1971/02/01   53 y.o. Female  MRN: [id number]  Subjective:    Chief Complaint  Patient presents with   Annual Exam    Physical    Erica Burch is a 53 y.o. female who presents today for a complete physical exam. She reports consuming a general diet, focuses on protein. Patient exercises as tolerated. Admits she wants to start walking more. She generally feels well. She reports sleeping fairly well. Was previously taking Lunesta  1.5 mg which helped with sleep. Tried eszopiclone  but was too expensive at the pharmacy. Failed Ramelteon  for sleep. Currently managing with melatonin as needed. Reports regular bowel movements.  She does not have additional problems to discuss today.    Most recent fall risk assessment:    04/12/2024   11:04 AM  Fall Risk   Falls in the past year? 0  Risk for fall due to : No Fall Risks  Follow up Falls evaluation completed     Most recent depression screenings:    04/12/2024   11:04 AM 05/12/2023    3:55 PM  PHQ 2/9 Scores  PHQ - 2 Score 0 0  PHQ- 9 Score  1    Vision:Within last year and Dental: No current dental problems and Receives regular dental care    Patient Care Team: Gayle Saddie JULIANNA DEVONNA as PCP - General (Physician Assistant)   Outpatient Medications Prior to Visit  Medication Sig   Multiple Vitamin (MULTIVITAMIN PO) Take 1 tablet by mouth daily at 6 (six) AM.   [DISCONTINUED] ALPRAZolam  (XANAX ) 0.25 MG tablet Take one tabs daily prn anxiety.   [DISCONTINUED] eszopiclone  3 MG TABS Take 1 tablet (3 mg total) by mouth at bedtime as needed for sleep. Take immediately before bedtime   [DISCONTINUED] ramelteon  (ROZEREM ) 8 MG tablet Take 1 tablet (8 mg total) by mouth at bedtime. (Patient not taking: Reported on 05/27/2023)   No facility-administered medications prior to visit.    ROS   Per HPI     Objective:     BP (!) 133/91   Pulse 74   Ht 5' 4 (1.626 m)    Wt 161 lb 0.6 oz (73 kg)   LMP 04/28/2009   SpO2 100%   BMI 27.64 kg/m    Physical Exam Constitutional:      General: She is not in acute distress.    Appearance: Normal appearance.  HENT:     Right Ear: Tympanic membrane normal.     Left Ear: Tympanic membrane normal.     Mouth/Throat:     Mouth: Mucous membranes are moist.     Pharynx: Oropharynx is clear.  Eyes:     Pupils: Pupils are equal, round, and reactive to light.  Cardiovascular:     Rate and Rhythm: Normal rate and regular rhythm.     Heart sounds: Normal heart sounds. No murmur heard.    No friction rub. No gallop.  Pulmonary:     Effort: Pulmonary effort is normal. No respiratory distress.     Breath sounds: Normal breath sounds.  Musculoskeletal:        General: No swelling.     Cervical back: Neck supple.  Lymphadenopathy:     Cervical: No cervical adenopathy.  Skin:    General: Skin is warm and dry.  Neurological:     General: No focal deficit present.     Mental Status: She is alert.  Psychiatric:  Mood and Affect: Mood normal.        Behavior: Behavior normal.        Thought Content: Thought content normal.      No results found for any visits on 04/12/24. Last CBC Lab Results  Component Value Date   WBC 4.1 04/05/2024   HGB 13.3 04/05/2024   HCT 39.9 04/05/2024   MCV 87 04/05/2024   MCH 29.0 04/05/2024   RDW 13.1 04/05/2024   PLT 265 04/05/2024   Last metabolic panel Lab Results  Component Value Date   GLUCOSE 91 04/05/2024   NA 142 04/05/2024   K 4.6 04/05/2024   CL 106 04/05/2024   CO2 22 04/05/2024   BUN 12 04/05/2024   CREATININE 0.85 04/05/2024   EGFR 83 04/05/2024   CALCIUM 9.4 04/05/2024   PROT 6.7 04/05/2024   ALBUMIN 4.4 04/05/2024   LABGLOB 2.3 04/05/2024   AGRATIO 1.9 10/09/2022   BILITOT 0.4 04/05/2024   ALKPHOS 84 04/05/2024   AST 20 04/05/2024   ALT 16 04/05/2024   ANIONGAP 4 (L) 02/19/2016   Last lipids Lab Results  Component Value Date   CHOL 198  04/05/2024   HDL 58 04/05/2024   LDLCALC 127 (H) 04/05/2024   TRIG 73 04/05/2024   CHOLHDL 3.4 04/05/2024   Last hemoglobin A1c Lab Results  Component Value Date   HGBA1C 5.2 04/05/2024   Last thyroid  functions Lab Results  Component Value Date   TSH 2.810 04/05/2024   Last vitamin D  Lab Results  Component Value Date   VD25OH 36.4 04/05/2024        Assessment & Plan:    Routine Health Maintenance and Physical Exam  Health Maintenance  Topic Date Due   Hepatitis B Vaccine (1 of 3 - 19+ 3-dose series) Never done   Zoster (Shingles) Vaccine (1 of 2) Never done   DTaP/Tdap/Td vaccine (2 - Td or Tdap) 07/02/2022   COVID-19 Vaccine (1 - 2024-25 season) Never done   Pap Smear  10/08/2023   Flu Shot  04/29/2024   Colon Cancer Screening  06/07/2030   Hepatitis C Screening  Completed   HIV Screening  Completed   HPV Vaccine  Aged Out   Meningitis B Vaccine  Aged Out   Mammogram  Discontinued    Discussed health benefits of physical activity, and encouraged her to engage in regular exercise appropriate for her age and condition. Up to date on all health screenings. Went over all labs in detail with patient. Answered all patient questions. Updated TDAP vaccine today. Encouraged her to schedule OB appt to get her pap smear. Will plan for next CPE in 1 year, sooner prn. Patient verbalized understanding and was in agreement with the plan.  Primary insomnia Assessment & Plan: Was previously on Lunesta  1.5 mg which helped intermittently with staying asleep. Dr. Chandra prescribed her survorexant but it was too expensive at the pharmacy. Has failed Ramelteon . Patient have never taken Trazodone . Will do a 30 day trial of Trazodone  50 mg nightly PRN for sleep. Advised patient to notify if she feels it is not helping with sleep and we can replace with Lunesta  1.5 - 2 mg nightly PRN instead.   Generalized anxiety disorder Assessment & Plan: Managing with alprazolam  0.25 mg as needed for  anxiety attacks. No concern for abuse or dependence. PDMP reviewed, no aberrancies. Refill provided today.  Orders: -     ALPRAZolam ; Take one tabs daily prn anxiety.  Dispense: 30 tablet; Refill: 3  Other orders -     traZODone  HCl; Take 1 tablet (50 mg total) by mouth at bedtime.  Dispense: 30 tablet; Refill: 0 -     Tdap vaccine greater than or equal to 7yo IM    Return in about 1 year (around 04/12/2025) for Physical.     Saddie JULIANNA Sacks, PA-C

## 2024-04-12 NOTE — Assessment & Plan Note (Signed)
 Was previously on Lunesta  1.5 mg which helped intermittently with staying asleep. Dr. Chandra prescribed her survorexant but it was too expensive at the pharmacy. Has failed Ramelteon . Patient have never taken Trazodone . Will do a 30 day trial of Trazodone  50 mg nightly PRN for sleep. Advised patient to notify if she feels it is not helping with sleep and we can replace with Lunesta  1.5 - 2 mg nightly PRN instead.

## 2024-04-12 NOTE — Patient Instructions (Signed)
 It was nice to see you today!  As we discussed in clinic:  -All of your lab work was normal except for mildly elevated LDL. We can control this with diet and exercise and if you would like to try natural supplements to improve cholesterol, I recommend Red Yeast Rice and/or Co-Q-10 over-the-counter. -I have refilled your alprazolam  and I have sent in Trazodone  50 mg to take nightly as needed for sleep. If you feel that this medication is not helpful for sleep, please let me know and we can switch back to the Lunesta !  -At your convenience, please schedule an OB/GYN appointment to update your pap smear!  -I will plan on seeing you back in 1 year for your physical with labs the week before! It was nice to meet you!  If you have any problems before your next visit feel free to message me via MyChart (minor issues or questions) or call the office, otherwise you may reach out to schedule an office visit.  Thank you! Saddie Sacks, PA-C

## 2024-05-13 MED ORDER — ESZOPICLONE 1 MG PO TABS: 1.5000 mg | ORAL_TABLET | Freq: Every evening | ORAL | 0 refills | Status: DC | PRN

## 2024-05-13 NOTE — Addendum Note (Signed)
 Addended byBETHA GAYLE NUMBERS on: 05/13/2024 08:05 AM   Modules accepted: Orders

## 2024-07-07 ENCOUNTER — Other Ambulatory Visit: Payer: Self-pay

## 2024-09-29 ENCOUNTER — Other Ambulatory Visit: Payer: Self-pay

## 2025-04-06 ENCOUNTER — Other Ambulatory Visit

## 2025-04-13 ENCOUNTER — Encounter
